# Patient Record
Sex: Female | Born: 1990 | Hispanic: No | Marital: Married | Smoking: Never smoker
Health system: Southern US, Community
[De-identification: ages and names within clinical notes are randomized; demographics above are authoritative.]

---

## 2015-11-20 ENCOUNTER — Emergency Department: Payer: BLUE CROSS/BLUE SHIELD

## 2015-11-20 ENCOUNTER — Observation Stay
Admission: EM | Admit: 2015-11-20 | Discharge: 2015-11-21 | Disposition: A | Discharge status: Home / Self Care | Payer: BLUE CROSS/BLUE SHIELD | Attending: Obstetrics and Gynecology | Admitting: Obstetrics and Gynecology

## 2015-11-20 DIAGNOSIS — R102 Pelvic and perineal pain: Secondary | ICD-10-CM

## 2015-11-20 DIAGNOSIS — K529 Noninfective gastroenteritis and colitis, unspecified: Secondary | ICD-10-CM | POA: Diagnosis not present

## 2015-11-20 DIAGNOSIS — O26891 Other specified pregnancy related conditions, first trimester: Secondary | ICD-10-CM | POA: Diagnosis not present

## 2015-11-20 DIAGNOSIS — R112 Nausea with vomiting, unspecified: Secondary | ICD-10-CM | POA: Insufficient documentation

## 2015-11-20 DIAGNOSIS — N83202 Unspecified ovarian cyst, left side: Secondary | ICD-10-CM | POA: Diagnosis not present

## 2015-11-20 DIAGNOSIS — R197 Diarrhea, unspecified: Secondary | ICD-10-CM | POA: Insufficient documentation

## 2015-11-20 DIAGNOSIS — O26899 Other specified pregnancy related conditions, unspecified trimester: Secondary | ICD-10-CM

## 2015-11-20 DIAGNOSIS — R109 Unspecified abdominal pain: Secondary | ICD-10-CM | POA: Diagnosis not present

## 2015-11-20 DIAGNOSIS — Z3A01 Less than 8 weeks gestation of pregnancy: Secondary | ICD-10-CM | POA: Diagnosis not present

## 2015-11-20 DIAGNOSIS — N83201 Unspecified ovarian cyst, right side: Secondary | ICD-10-CM | POA: Insufficient documentation

## 2015-11-20 DIAGNOSIS — R1084 Generalized abdominal pain: Secondary | ICD-10-CM | POA: Diagnosis present

## 2015-11-20 LAB — URINALYSIS COMPLETE WITH MICROSCOPIC (ARMC ONLY)
Bilirubin Urine: NEGATIVE
GLUCOSE, UA: NEGATIVE mg/dL
Hgb urine dipstick: NEGATIVE
Nitrite: NEGATIVE
PROTEIN: 30 mg/dL — AB
Specific Gravity, Urine: 1.026 (ref 1.005–1.030)
pH: 5 (ref 5.0–8.0)

## 2015-11-20 LAB — COMPREHENSIVE METABOLIC PANEL
ALK PHOS: 59 U/L (ref 38–126)
ALT: 11 U/L — AB (ref 14–54)
ANION GAP: 12 (ref 5–15)
AST: 17 U/L (ref 15–41)
Albumin: 4.2 g/dL (ref 3.5–5.0)
BILIRUBIN TOTAL: 0.6 mg/dL (ref 0.3–1.2)
BUN: 5 mg/dL — ABNORMAL LOW (ref 6–20)
CALCIUM: 9 mg/dL (ref 8.9–10.3)
CO2: 19 mmol/L — AB (ref 22–32)
CREATININE: 0.5 mg/dL (ref 0.44–1.00)
Chloride: 105 mmol/L (ref 101–111)
Glucose, Bld: 108 mg/dL — ABNORMAL HIGH (ref 65–99)
Potassium: 3.4 mmol/L — ABNORMAL LOW (ref 3.5–5.1)
SODIUM: 136 mmol/L (ref 135–145)
TOTAL PROTEIN: 7.1 g/dL (ref 6.5–8.1)

## 2015-11-20 LAB — HCG, QUANTITATIVE, PREGNANCY: hCG, Beta Chain, Quant, S: 64510 m[IU]/mL — ABNORMAL HIGH (ref <5)

## 2015-11-20 LAB — CBC
HEMATOCRIT: 46.2 % (ref 35.0–47.0)
HEMOGLOBIN: 16.2 g/dL — AB (ref 12.0–16.0)
MCH: 29.7 pg (ref 26.0–34.0)
MCHC: 35.1 g/dL (ref 32.0–36.0)
MCV: 84.5 fL (ref 80.0–100.0)
Platelets: 241 10*3/uL (ref 150–440)
RBC: 5.47 MIL/uL — AB (ref 3.80–5.20)
RDW: 12.7 % (ref 11.5–14.5)
WBC: 14.7 10*3/uL — ABNORMAL HIGH (ref 3.6–11.0)

## 2015-11-20 MED ORDER — MORPHINE SULFATE (PF) 2 MG/ML IV SOLN
2.0000 mg | Freq: Once | INTRAVENOUS | Status: AC
  Administered 2015-11-20: 2 mg via INTRAVENOUS

## 2015-11-20 MED ORDER — ONDANSETRON HCL 4 MG/2ML IJ SOLN
4.0000 mg | Freq: Once | INTRAMUSCULAR | Status: AC
  Administered 2015-11-20: 4 mg via INTRAVENOUS

## 2015-11-20 MED ORDER — SODIUM CHLORIDE 0.9 % IV BOLUS (SEPSIS)
1000.0000 mL | Freq: Once | INTRAVENOUS | Status: AC
  Administered 2015-11-20: 1000 mL via INTRAVENOUS

## 2015-11-20 MED ORDER — MORPHINE SULFATE (PF) 2 MG/ML IV SOLN
INTRAVENOUS | Status: AC
  Filled 2015-11-20: qty 1

## 2015-11-20 MED ORDER — ONDANSETRON HCL 4 MG/2ML IJ SOLN
INTRAMUSCULAR | Status: AC
  Filled 2015-11-20: qty 2

## 2015-11-20 NOTE — ED Notes (Signed)
Patient presents for abdominal cramping that started two days ago. Patient states N/V/D a couple of times over the past couple of days. Patient is pale, able to answer questions appropriately. Denies vaginal bleeding. States [redacted] weeks pregnant.

## 2015-11-20 NOTE — ED Notes (Signed)
Patient transported to Ultrasound 

## 2015-11-20 NOTE — ED Provider Notes (Signed)
[facility] Emergency Department Provider Note  ____________________________________________  Time seen: 11:30 PM  I have reviewed the triage vital signs and the nursing notes.   HISTORY  Chief Complaint Abdominal Cramping     HPI Wanda Wyatt is a 25 y.o. female gravida 2 para 1 proximally [redacted] weeks pregnant presents with generalized abdominal pain with onset 4 days ago accompanied by nausea patient denies any vomiting or diarrhea. Patient denies any vaginal bleeding. Patient has not seen obstetrics to date LMP 09/25/15. Patient states that her current pain score is 10 out of 10    Past medical history 1 previous successful pregnancy without any complications. There are no active problems to display for this patient.   Past surgical history None No current outpatient prescriptions on file.  Allergies No known drug allergies No family history on file.  Social History Social History  Substance Use Topics  . Smoking status: None  . Smokeless tobacco: None  . Alcohol Use: None    Review of Systems  Constitutional: Negative for fever. Eyes: Negative for visual changes. ENT: Negative for sore throat. Cardiovascular: Negative for chest pain. Respiratory: Negative for shortness of breath. Gastrointestinal: Positive for abdominal pain and nausea Genitourinary: Negative for dysuria. Musculoskeletal: Negative for back pain. Skin: Negative for rash. Neurological: Negative for headaches, focal weakness or numbness.   10-point ROS otherwise negative.  ____________________________________________   PHYSICAL EXAM:  VITAL SIGNS: ED Triage Vitals  Enc Vitals Group     BP 11/20/15 2224 94/76 mmHg     Pulse Rate 11/20/15 2224 92     Resp 11/20/15 2224 20     Temp 11/20/15 2224 98 F (36.7 C)     Temp Source 11/20/15 2224 Oral     SpO2 11/20/15 2224 97 %     Weight 11/20/15 2224 145 lb (65.772 kg)     Height 11/20/15 2224 5\' 4"  (1.626 m)      Head Cir --      Peak Flow --      Pain Score 11/20/15 2225 8     Pain Loc --      Pain Edu? --      Excl. in GC? --      Constitutional: Alert and oriented. Apparent distress, writhing in distress Eyes: Conjunctivae are normal. PERRL. Normal extraocular movements. ENT   Head: Normocephalic and atraumatic.   Nose: No congestion/rhinnorhea.   Mouth/Throat: Mucous membranes are moist.   Neck: No stridor. Hematological/Lymphatic/Immunilogical: No cervical lymphadenopathy. Cardiovascular: Normal rate, regular rhythm. Normal and symmetric distal pulses are present in all extremities. No murmurs, rubs, or gallops. Respiratory: Normal respiratory effort without tachypnea nor retractions. Breath sounds are clear and equal bilaterally. No wheezes/rales/rhonchi. Gastrointestinal: Generalized abdominal tenderness No distention. There is no CVA tenderness. Genitourinary: deferred Musculoskeletal: Nontender with normal range of motion in all extremities. No joint effusions.  No lower extremity tenderness nor edema. Neurologic:  Normal speech and language. No gross focal neurologic deficits are appreciated. Speech is normal.  Skin:  Skin is warm, dry and intact. No rash noted. Psychiatric: Mood and affect are normal. Speech and behavior are normal. Patient exhibits appropriate insight and judgment.  ____________________________________________    LABS (pertinent positives/negatives)  Labs Reviewed  CBC - Abnormal; Notable for the following:    WBC 14.7 (*)    RBC 5.47 (*)    Hemoglobin 16.2 (*)    All other components within normal limits  COMPREHENSIVE METABOLIC PANEL - Abnormal; Notable for the following:  Potassium 3.4 (*)    CO2 19 (*)    Glucose, Bld 108 (*)    BUN 5 (*)    ALT 11 (*)    All other components within normal limits  HCG, QUANTITATIVE, PREGNANCY - Abnormal; Notable for the following:    hCG, Beta Chain, Quant, S 25366 (*)    All other components within  normal limits  URINALYSIS COMPLETEWITH MICROSCOPIC ([facility] ONLY) - Abnormal; Notable for the following:    Color, Urine AMBER (*)    APPearance HAZY (*)    Ketones, ur 2+ (*)    Protein, ur 30 (*)    Leukocytes, UA 1+ (*)    Bacteria, UA MANY (*)    Squamous Epithelial / LPF 0-5 (*)    All other components within normal limits  CBC WITH DIFFERENTIAL/PLATELET      RADIOLOGY           Imaging Results       MR Abdomen Wo Contrast (Final result) Result time: 11/21/15 03:27:50   Procedure changed from MR Abdomen W Wo Contrast      Final result by Rad Results In Interface (11/21/15 03:27:50)   Narrative:   CLINICAL DATA: Patient is [redacted] weeks pregnant and presents with abdominal cramping starting 2 days ago. Nausea, vomiting, and diarrhea. No vaginal bleeding. No known injury.  EXAM: MRI ABDOMEN WITHOUT CONTRAST  TECHNIQUE: Multiplanar multisequence MR imaging was performed without the administration of intravenous contrast.  COMPARISON: None.  FINDINGS: Examination is performed without IV contrast material, limiting evaluation of solid organs. As visualized, the liver, spleen, gallbladder, bile ducts, pancreas, adrenal glands, kidneys, abdominal aorta, inferior vena cava, and retroperitoneal lymph nodes are unremarkable. There is moderate diffuse free fluid demonstrated throughout the abdomen and pelvis. Stomach, small bowel, and colon are not abnormally distended. There is diffuse wall thickening and edema with fold thickening involving much of the mid abdominal small bowel, likely representing the proximal to mid ileum. The terminal ileum is not involved. Appearances are consistent with infectious or inflammatory enteritis.  Pelvis: The uterus is identified with intra limited gestational sac visualized. No fetal pole is identified. Correlate with ultrasound pelvic report from today's date. Both ovaries are visualized. Small cysts in both ovaries. Normal  follicular changes. No abnormal adnexal masses. Bladder wall is not thickened. The appendix is identified and appears normal.  IMPRESSION: Diffuse wall thickening, fold thickening, and edema of the mid abdominal small bowel likely to represent enteritis. No obstruction. Diffuse free fluid in the abdomen and pelvis may be reactive. The appendix is identified and appears normal. No evidence to suggest appendicitis. No abnormal adnexal masses. Small cysts in both ovaries. Intrauterine gestational sac demonstrated but fetal pole is not visualized.   Electronically Signed By: Burman Nieves M.D. On: 11/21/2015 03:27          MR Pelvis Wo Contrast (Final result) Result time: 11/21/15 03:27:50   Final result by Rad Results In Interface (11/21/15 03:27:50)   Narrative:   CLINICAL DATA: Patient is [redacted] weeks pregnant and presents with abdominal cramping starting 2 days ago. Nausea, vomiting, and diarrhea. No vaginal bleeding. No known injury.  EXAM: MRI ABDOMEN WITHOUT CONTRAST  TECHNIQUE: Multiplanar multisequence MR imaging was performed without the administration of intravenous contrast.  COMPARISON: None.  FINDINGS: Examination is performed without IV contrast material, limiting evaluation of solid organs. As visualized, the liver, spleen, gallbladder, bile ducts, pancreas, adrenal glands, kidneys, abdominal aorta, inferior vena cava, and retroperitoneal lymph nodes are unremarkable.  There is moderate diffuse free fluid demonstrated throughout the abdomen and pelvis. Stomach, small bowel, and colon are not abnormally distended. There is diffuse wall thickening and edema with fold thickening involving much of the mid abdominal small bowel, likely representing the proximal to mid ileum. The terminal ileum is not involved. Appearances are consistent with infectious or inflammatory enteritis.  Pelvis: The uterus is identified with intra limited gestational  sac visualized. No fetal pole is identified. Correlate with ultrasound pelvic report from today's date. Both ovaries are visualized. Small cysts in both ovaries. Normal follicular changes. No abnormal adnexal masses. Bladder wall is not thickened. The appendix is identified and appears normal.  IMPRESSION: Diffuse wall thickening, fold thickening, and edema of the mid abdominal small bowel likely to represent enteritis. No obstruction. Diffuse free fluid in the abdomen and pelvis may be reactive. The appendix is identified and appears normal. No evidence to suggest appendicitis. No abnormal adnexal masses. Small cysts in both ovaries. Intrauterine gestational sac demonstrated but fetal pole is not visualized.   Electronically Signed By: Burman Nieves M.D. On: 11/21/2015 03:27          US Ob Transvaginal (Final result) Result time: 11/21/15 00:30:24   Final result by Rad Results In Interface (11/21/15 00:30:24)   Narrative:   CLINICAL DATA: 25 year old female mid abdominal pain and cramping.  EXAM: OBSTETRIC <14 WK [place] AND TRANSVAGINAL OB US  TECHNIQUE: Both transabdominal and transvaginal ultrasound examinations were performed for complete evaluation of the gestation as well as the maternal uterus, adnexal regions, and pelvic cul-de-sac. Transvaginal technique was performed to assess early pregnancy.  COMPARISON: None.  FINDINGS: There is an intrauterine cystic structure with apparent decidual reaction. No yolk sac or fetal pole identified within this structure. This cystic structure likely represents an early gestational sac given surrounding decidual reaction. A pseudogestation of an ectopic pregnancy is therefore less likely but not excluded. Correlation with clinical exam and follow-up with serial HCG levels and ultrasound recommended.  If this cystic structure is a true gestational sac the estimated gestational age based on mean sac diameter of 18  mm is 6 weeks, 5 days. However, the absence of fetal pole or a yolk sac in a gestational sac of this size, is concerning but not definitive indication of pregnancy failure. Close follow-up recommended.  Small subchorionic hemorrhage present (image 32/51) measuring 1.1 x 0.6 cm.  The right ovary measures 3.4 x 2.3 x 2.5 cm. There is a 2.0 x 1.7 x 1.8 cm hemorrhagic cyst with fluid/debris level in the right ovary. The left ovary is not visualized.  There is moderate amount of free fluid within the pelvis. This fluid appears simple without definite hemorrhagic component.  IMPRESSION: Intrauterine cystic structure as described. No fetal pole or yolk sac identified at this time. Correlation with clinical exam and follow-up with serial HCG levels and ultrasound recommended.  Right ovarian hemorrhagic cyst.  Moderate amount of simple appearing free fluid within the pelvis.  These results were called by telephone at the time of interpretation on 11/21/2015 at 12:20 am to Dr. Manson Passey who verbally acknowledged these results.   Electronically Signed By: Elgie Collard M.D. On: 11/21/2015 00:30          US OB Comp Less 14 Wks (Final result) Result time: 11/21/15 00:30:24   Final result by Rad Results In Interface (11/21/15 00:30:24)   Narrative:   CLINICAL DATA: 25 year old female mid abdominal pain and cramping.  EXAM: OBSTETRIC <14 WK [place] AND TRANSVAGINAL OB  US  TECHNIQUE: Both transabdominal and transvaginal ultrasound examinations were performed for complete evaluation of the gestation as well as the maternal uterus, adnexal regions, and pelvic cul-de-sac. Transvaginal technique was performed to assess early pregnancy.  COMPARISON: None.  FINDINGS: There is an intrauterine cystic structure with apparent decidual reaction. No yolk sac or fetal pole identified within this structure. This cystic structure likely represents an early gestational sac given  surrounding decidual reaction. A pseudogestation of an ectopic pregnancy is therefore less likely but not excluded. Correlation with clinical exam and follow-up with serial HCG levels and ultrasound recommended.  If this cystic structure is a true gestational sac the estimated gestational age based on mean sac diameter of 18 mm is 6 weeks, 5 days. However, the absence of fetal pole or a yolk sac in a gestational sac of this size, is concerning but not definitive indication of pregnancy failure. Close follow-up recommended.  Small subchorionic hemorrhage present (image 32/51) measuring 1.1 x 0.6 cm.  The right ovary measures 3.4 x 2.3 x 2.5 cm. There is a 2.0 x 1.7 x 1.8 cm hemorrhagic cyst with fluid/debris level in the right ovary. The left ovary is not visualized.  There is moderate amount of free fluid within the pelvis. This fluid appears simple without definite hemorrhagic component.  IMPRESSION: Intrauterine cystic structure as described. No fetal pole or yolk sac identified at this time. Correlation with clinical exam and follow-up with serial HCG levels and ultrasound recommended.  Right ovarian hemorrhagic cyst.  Moderate amount of simple appearing free fluid within the pelvis.  These results were called by telephone at the time of interpretation on 11/21/2015 at 12:20 am to Dr. Manson Passey who verbally acknowledged these results.   Electronically Signed By: Elgie Collard M.D. On: 11/21/2015 00:30       Critical Care performed: CRITICAL CARE Performed by: Darci Current   Total critical care time: 40 minutes  Critical care time was exclusive of separately billable procedures and treating other patients.  Critical care was necessary to treat or prevent imminent or life-threatening deterioration.  Critical care was time spent personally by me on the following activities: development of treatment plan with patient and/or surrogate as well as nursing,  discussions with consultants, evaluation of patient's response to treatment, examination of patient, obtaining history from patient or surrogate, ordering and performing treatments and interventions, ordering and review of laboratory studies, ordering and review of radiographic studies, pulse oximetry and re-evaluation of patient's condition.   ____________________________________________   INITIAL IMPRESSION / ASSESSMENT AND PLAN / ED COURSE  Pertinent labs & imaging results that were available during my care of the patient were reviewed by me and considered in my medical decision making (see chart for details).  Patient received IV morphine 2 mg and Zofran 4 mg on presentation to the emergency department. On reevaluation patient states that her pain is much improved. Patient discussed with Dr. Gwenyth Bender radiologist regarding ultrasound findings. Patient then discussed with Dr. Dalbert Garnet OB/GYN on call who evaluated the patient emergency department promptly. MRI of the abdomen revealed no evidence of appendicitis. Given hCG quantitative level beyond described discriminatory level, absence of fetal pole on ultrasound and intra-abdominal fluid patient admitted to Dr. Dalbert Garnet OB/GYN for further evaluation and management  ____________________________________________   FINAL CLINICAL IMPRESSION(S) / ED DIAGNOSES  Final diagnoses:  Pelvic pain in pregnancy      Darci Current, MD 11/21/15 [phone]

## 2015-11-21 ENCOUNTER — Encounter: Payer: Self-pay | Admitting: Obstetrics and Gynecology

## 2015-11-21 ENCOUNTER — Emergency Department: Payer: BLUE CROSS/BLUE SHIELD

## 2015-11-21 DIAGNOSIS — R109 Unspecified abdominal pain: Secondary | ICD-10-CM

## 2015-11-21 DIAGNOSIS — O26899 Other specified pregnancy related conditions, unspecified trimester: Secondary | ICD-10-CM

## 2015-11-21 LAB — CBC WITH DIFFERENTIAL/PLATELET
BASOS PCT: 0 %
Basophils Absolute: 0 10*3/uL (ref 0–0.1)
EOS ABS: 0.1 10*3/uL (ref 0–0.7)
EOS PCT: 1 %
HCT: 38.9 % (ref 35.0–47.0)
HEMOGLOBIN: 13.5 g/dL (ref 12.0–16.0)
Lymphocytes Relative: 25 %
Lymphs Abs: 1.7 10*3/uL (ref 1.0–3.6)
MCH: 30 pg (ref 26.0–34.0)
MCHC: 34.8 g/dL (ref 32.0–36.0)
MCV: 86.1 fL (ref 80.0–100.0)
MONOS PCT: 8 %
Monocytes Absolute: 0.5 10*3/uL (ref 0.2–0.9)
NEUTROS PCT: 66 %
Neutro Abs: 4.6 10*3/uL (ref 1.4–6.5)
PLATELETS: 205 10*3/uL (ref 150–440)
RBC: 4.51 MIL/uL (ref 3.80–5.20)
RDW: 12.6 % (ref 11.5–14.5)
WBC: 6.9 10*3/uL (ref 3.6–11.0)

## 2015-11-21 MED ORDER — MAGNESIUM CITRATE PO SOLN: 1.0000 | Freq: Once | ORAL | Status: DC | PRN

## 2015-11-21 MED ORDER — PRENATAL MULTIVITAMIN CH: 1.0000 | ORAL_TABLET | Freq: Every day | ORAL | Status: DC

## 2015-11-21 MED ORDER — ALUM & MAG HYDROXIDE-SIMETH 200-200-20 MG/5ML PO SUSP: 30.0000 mL | ORAL | Status: DC | PRN

## 2015-11-21 MED ORDER — LOPERAMIDE HCL 2 MG PO CAPS: 2.0000 mg | ORAL_CAPSULE | ORAL | Status: DC | PRN

## 2015-11-21 MED ORDER — PROCHLORPERAZINE EDISYLATE 5 MG/ML IJ SOLN
10.0000 mg | Freq: Four times a day (QID) | INTRAMUSCULAR | Status: DC | PRN
  Filled 2015-11-21: qty 2

## 2015-11-21 MED ORDER — BISACODYL 5 MG PO TBEC: 5.0000 mg | DELAYED_RELEASE_TABLET | Freq: Every day | ORAL | Status: DC | PRN

## 2015-11-21 MED ORDER — IBUPROFEN 600 MG PO TABS
600.0000 mg | ORAL_TABLET | Freq: Four times a day (QID) | ORAL | Status: DC | PRN
Start: 2015-11-21 — End: 2015-11-21

## 2015-11-21 MED ORDER — PIPERACILLIN-TAZOBACTAM 3.375 G IVPB: 3.3750 g | Freq: Once | INTRAVENOUS | Status: DC

## 2015-11-21 MED ORDER — SODIUM CHLORIDE 0.9 % IV SOLN
1.5000 g | Freq: Once | INTRAVENOUS | Status: AC
  Administered 2015-11-21: 1.5 g via INTRAVENOUS
  Filled 2015-11-21: qty 1.5

## 2015-11-21 MED ORDER — HYDROMORPHONE HCL 1 MG/ML IJ SOLN: 0.2000 mg | INTRAMUSCULAR | Status: DC | PRN

## 2015-11-21 MED ORDER — LACTATED RINGERS IV SOLN
INTRAVENOUS | Status: DC
  Administered 2015-11-21: 06:00:00 via INTRAVENOUS

## 2015-11-21 MED ORDER — TEMAZEPAM 15 MG PO CAPS: 15.0000 mg | ORAL_CAPSULE | Freq: Every evening | ORAL | Status: DC | PRN

## 2015-11-21 MED ORDER — DOCUSATE SODIUM 100 MG PO CAPS: 100.0000 mg | ORAL_CAPSULE | Freq: Two times a day (BID) | ORAL | Status: DC

## 2015-11-21 MED ORDER — MAGNESIUM HYDROXIDE 400 MG/5ML PO SUSP: 30.0000 mL | Freq: Every day | ORAL | Status: DC | PRN

## 2015-11-21 MED ORDER — OXYCODONE-ACETAMINOPHEN 5-325 MG PO TABS: 1.0000 | ORAL_TABLET | ORAL | Status: DC | PRN

## 2015-11-21 NOTE — ED Notes (Signed)
MD Manson PasseyBrown and OB at bedside.

## 2015-11-21 NOTE — Discharge Summary (Signed)
Physician Discharge Summary  Patient ID: Wanda Wyatt MRN: [id number] DOB/AGE: [birth year] 25 y.o.  Admit date: 11/20/2015 Discharge date: 11/21/2015  Admission Diagnoses:  Discharge Diagnoses:  Active Problems:   Abdominal pain in pregnancy, antepartum   Discharged Condition: good  Hospital Course: 25 y.o. G2P1001 at 6189w2d by LMP and 3011w5d by ultrasound today admitted for observation with acute onset abdominal pain 2 days ago. +nausea, no vomiting. No vaginal bleeding. bhcg 64,000 but no evidence of yolk sac or fetal pole. No evidence of ectopic pregnancy on MRI or TVUS, but with large amount of intra-abdominal and pelvic FF.Also w/ enteritis on MRI. Likely a failing IUP/anembryonic pregnancy. Hemodynamically stable. Admitted overnight for observation. Received no further pain control medications. Stable labs in the AM. Patient desired discharge home. Will f/u in 1 week for repeat ultrasound to confirm if pregnancy is failing. Precautions reviewed.   CBC Latest Ref Rng 11/21/2015 11/20/2015  WBC 3.6 - 11.0 K/uL 6.9 14.7(H)  Hemoglobin 12.0 - 16.0 g/dL 40.913.5 16.2(H)  Hematocrit 35.0 - 47.0 % 38.9 46.2  Platelets 150 - 440 K/uL 205 241   UA; +ketones, 1+ leuk, many bacteria  Discharge Exam: Blood pressure 98/50, pulse 67, temperature 98.3 F (36.8 C), temperature source Oral, resp. rate 10, height 5\' 4"  (1.626 m), weight 65.772 kg (145 lb), last menstrual period 10/01/2015, SpO2 100 %. General appearance: alert, cooperative and no distress GI: soft, mildly tender throughout without rebound or guarding Extremities: extremities normal, atraumatic, no cyanosis or edema  Disposition: 01-Home or Self Care     Medication List    Notice    You have not been prescribed any medications.         Follow-up Information    Follow up with Ala DachJohanna K Halfon, MD In 1 week.   Specialty:  Obstetrics and Gynecology   Contact information:   [address] [place] [id number] [phone]       Signed: Ala DachJohanna K Halfon 11/21/2015, 5:59 PM

## 2015-11-21 NOTE — ED Notes (Addendum)
Patient transported to MRI at this time via ED tech. Pt stable, NAD noted.

## 2015-11-21 NOTE — H&P (Signed)
FACULTY PRACTICE ANTEPARTUM ADMISSION HISTORY AND PHYSICAL NOTE   History of Present Illness: Wanda Wyatt is a 25 y.o. G2P1001 at [redacted]w[redacted]d by LMP and [redacted]w[redacted]d by ultrasound today admitted for observation with acute onset abdominal pain 2 days ago. +nausea, no vomiting. No vaginal bleeding. Pain intensifies with movement, improved with rest. No diarrhea or constipation. No fever, chills. Pain 9/10 at worst, improved with  of morphine in the ER.  Prior pregnancy with NSVD without complication. Denies history of STDs.  Ultrasound significant for moderate amount of simple fluid in abdominal cavity, but no adnexal pathology beyond simple hemorrhagic cyst in right ovary.  No complex ovarian or fallopian tube mass suggestive of ectopic pregnancy. Gestational sac with mean sac diameter of 18mm without yolk sac, suggestive of but not diagnostic of a failed IUP.  Beta quant 64,500 today.  She has received 1 dose of 1.5g Unasyn in the ER while awaiting MRI in case of ruptured appendicitis.  Patient Active Problem List   Diagnosis Date Noted  . Abdominal pain in pregnancy, antepartum 11/21/2015    History reviewed. No pertinent past medical history.  History reviewed. No pertinent past surgical history.  OB History  Gravida Para Term Preterm AB SAB TAB Ectopic Multiple Living  # Outcome Date GA Lbr Len/2nd Weight Sex Delivery Anes PTL Lv  2 Current           1 Term      Vag-Spont         Social History   Social History  . Marital Status: Married    Spouse Name: N/A  . Number of Children: N/A  . Years of Education: N/A   Social History Main Topics  . Smoking status: None  . Smokeless tobacco: None  . Alcohol Use: None  . Drug Use: None  . Sexual Activity: Not Asked   Other Topics Concern  . None   Social History Narrative    No family history on file.  No Known Allergies   (Not in a hospital admission)  Review of Systems - Negative except as noted in HPI    Vitals:  BP 93/60 mmHg  Pulse 73  Temp(Src) 98 F (36.7 C) (Oral)  Resp 14  Ht  (1.626 m)  Wt 145 lb (65.772 kg)  BMI 24.88 kg/m2  SpO2 98%  LMP 10/01/2015  Physical Examination: CONSTITUTIONAL: Well-developed, well-nourished female in no acute distress.  HENT:  Normocephalic, atraumatic, External right and left ear normal. Oropharynx is clear and moist EYES: Conjunctivae and EOM are normal. Pupils are equal, round, and reactive to light. No scleral icterus.  NECK: Normal range of motion, supple, no masses SKIN: Skin is warm and dry. No rash noted. Not diaphoretic. No erythema. No pallor. NEUROLGIC: Alert and oriented to person, place, and time. Normal reflexes, muscle tone coordination. No cranial nerve deficit noted. PSYCHIATRIC: Normal mood and affect. Normal behavior. Normal judgment and thought content. CARDIOVASCULAR: Normal heart rate noted, regular rhythm RESPIRATORY: Effort and breath sounds normal, no problems with respiration noted ABDOMEN: Soft, diffusely tender, nondistended. Tender to movement and palpation. +hypoactive bowel sounds, no tinkling.  MUSCULOSKELETAL: Normal range of motion. No edema and no tenderness. 2+ distal pulses.   Labs:  Results for orders placed or performed during the hospital encounter of 11/20/15 (from the past 24 hour(s))  CBC   Collection Time: 11/20/15 10:28 PM  Result Value Ref Range   WBC  14.7 (H) 3.6 - 11.0 K/uL   RBC 5.47 (H) 3.80 - 5.20 MIL/uL   Hemoglobin 16.2 (H) 12.0 - 16.0 g/dL   HCT 09.8 11.9 - 14.7 %   MCV 84.5 80.0 - 100.0 fL   MCH 29.7 26.0 - 34.0 pg   MCHC 35.1 32.0 - 36.0 g/dL   RDW 82.9 56.2 - 13.0 %   Platelets 241 150 - 440 K/uL  Comprehensive metabolic panel   Collection Time: 11/20/15 10:28 PM  Result Value Ref Range   Sodium 136 135 - 145 mmol/L   Potassium 3.4 (L) 3.5 - 5.1 mmol/L   Chloride 105 101 - 111 mmol/L   CO2 19 (L) 22 - 32 mmol/L   Glucose, Bld 108 (H) 65 - 99 mg/dL   BUN 5 (L) 6 - 20  mg/dL   Creatinine, Ser 8.65 0.44 - 1.00 mg/dL   Calcium 9.0 8.9 - 78.4 mg/dL   Total Protein 7.1 6.5 - 8.1 g/dL   Albumin 4.2 3.5 - 5.0 g/dL   AST 17 15 - 41 U/L   ALT 11 (L) 14 - 54 U/L   Alkaline Phosphatase 59 38 - 126 U/L   Total Bilirubin 0.6 0.3 - 1.2 mg/dL   GFR calc non Af Amer >60 >60 mL/min   GFR calc Af Amer >60 >60 mL/min   Anion gap 12 5 - 15  hCG, quantitative, pregnancy   Collection Time: 11/20/15 10:28 PM  Result Value Ref Range   hCG, Beta Chain, Quant, S 64510 (H) <5 mIU/mL  Urinalysis complete, with microscopic ([facility] only)   Collection Time: 11/20/15 10:31 PM  Result Value Ref Range   Color, Urine AMBER (A) YELLOW   APPearance HAZY (A) CLEAR   Glucose, UA NEGATIVE NEGATIVE mg/dL   Bilirubin Urine NEGATIVE NEGATIVE   Ketones, ur 2+ (A) NEGATIVE mg/dL   Specific Gravity, Urine 1.026 1.005 - 1.030   Hgb urine dipstick NEGATIVE NEGATIVE   pH 5.0 5.0 - 8.0   Protein, ur 30 (A) NEGATIVE mg/dL   Nitrite NEGATIVE NEGATIVE   Leukocytes, UA 1+ (A) NEGATIVE   RBC / HPF 0-5 0 - 5 RBC/hpf   WBC, UA 0-5 0 - 5 WBC/hpf   Bacteria, UA MANY (A) NONE SEEN   Squamous Epithelial / LPF 0-5 (A) NONE SEEN   Mucous PRESENT     Imaging Studies:  MRI Pelvis Wo Contrast  11/21/2015  CLINICAL DATA:  Patient is [redacted] weeks pregnant and presents with abdominal cramping starting 2 days ago. Nausea, vomiting, and diarrhea. No vaginal bleeding. No known injury. EXAM: MRI ABDOMEN WITHOUT CONTRAST TECHNIQUE: Multiplanar multisequence MR imaging was performed without the administration of intravenous contrast. COMPARISON:  None.   FINDINGS: Examination is performed without IV contrast material, limiting evaluation of solid organs. As visualized, the liver, spleen, gallbladder, bile ducts, pancreas, adrenal glands, kidneys, abdominal aorta, inferior vena cava, and retroperitoneal lymph nodes are unremarkable. There is moderate diffuse free fluid demonstrated throughout the abdomen and pelvis.  Stomach, small bowel, and colon are not abnormally distended. There is diffuse wall thickening and edema with fold thickening involving much of the mid abdominal small bowel, likely representing the proximal to mid ileum. The terminal ileum is not involved. Appearances are consistent with infectious or inflammatory enteritis. Pelvis: The uterus is identified with intra limited gestational sac visualized. No fetal pole is identified. Correlate with ultrasound pelvic report from today's date. Both ovaries are visualized. Small cysts in both ovaries. Normal follicular changes. No  abnormal adnexal masses. Bladder wall is not thickened. The appendix is identified and appears normal. IMPRESSION: Diffuse wall thickening, fold thickening, and edema of the mid abdominal small bowel likely to represent enteritis. No obstruction. Diffuse free fluid in the abdomen and pelvis may be reactive. The appendix is identified and appears normal. No evidence to suggest appendicitis. No abnormal adnexal masses. Small cysts in both ovaries. Intrauterine gestational sac demonstrated but fetal pole is not visualized. Electronically Signed   By: Burman NievesWilliam  Stevens M.D.   On: 11/21/2015 03:27   [place] Ob Comp Less 14 Wks  11/21/2015  CLINICAL DATA:  25 year old female mid abdominal pain and cramping. EXAM: OBSTETRIC <14 WK US AND TRANSVAGINAL OB US TECHNIQUE: Both transabdominal and transvaginal ultrasound examinations were performed for complete evaluation of the gestation as well as the maternal uterus, adnexal regions, and pelvic cul-de-sac. Transvaginal technique was performed to assess early pregnancy. COMPARISON:  None.   FINDINGS: There is an intrauterine cystic structure with apparent decidual reaction. No yolk sac or fetal pole identified within this structure. This cystic structure likely represents an early gestational sac given surrounding decidual reaction. A pseudogestation of an ectopic pregnancy is therefore less likely but not  excluded. Correlation with clinical exam and follow-up with serial HCG levels and ultrasound recommended. If this cystic structure is a true gestational sac the estimated gestational age based on mean sac diameter of 18 mm is 6 weeks, 5 days. However, the absence of fetal pole or a yolk sac in a gestational sac of this size, is concerning but not definitive indication of pregnancy failure. Close follow-up recommended. Small subchorionic hemorrhage present (image 32/51) measuring 1.1 x 0.6 cm. The right ovary measures 3.4 x 2.3 x 2.5 cm. There is a 2.0 x 1.7 x 1.8 cm hemorrhagic cyst with fluid/debris level in the right ovary. The left ovary is not visualized. There is moderate amount of free fluid within the pelvis. This fluid appears simple without definite hemorrhagic component. IMPRESSION: Intrauterine cystic structure as described. No fetal pole or yolk sac identified at this time. Correlation with clinical exam and follow-up with serial HCG levels and ultrasound recommended. Right ovarian hemorrhagic cyst. Moderate amount of simple appearing free fluid within the pelvis. These results were called by telephone at the time of interpretation on 11/21/2015 at 12:20 am to Dr. Manson PasseyBrown who verbally acknowledged these results. Electronically Signed   By: Elgie CollardArash  Radparvar M.D.   On: 11/21/2015 00:30    Assessment and Plan: Patient Active Problem List   Diagnosis Date Noted  . Abdominal pain in pregnancy, antepartum 11/21/2015   The clinical picture is unclear. DDx includes simple enteritis with reactive fluid, ruptured hemorrhagic cyst or ectopic pregnancy. Whether or not she has a failed IUP is also uncertain. She does have notable fluid in her peritoneal cavity but the fluid appears simple and not like blood, but no clear source of bleeding. Her H/H is also not suggestive of significant intraabdominal bleeding.   Attention must be given to possibility of ectopic pregnancy; however, MRI was reassuring that  adenxa are normal. Her normal hgb/hct, temp and heartrate are reassuring. Therefore, need for immediate dx laparoscopy not warranted.   She is admitted for observation and pain control. She will be NPO in case of acute worsening. IVF, pain control, antiemetics and anti-diarrheals ordered. Currently serial ultrasounds as an outpatient will help decide whether truly failed pregnancy and she has two issues coinciding.

## 2015-11-21 NOTE — Progress Notes (Signed)
Patient understands all discharge instructions and the need to make follow up appointments. Patient discharge via wheelchair with auxillary. 

## 2015-11-28 ENCOUNTER — Ambulatory Visit
Admission: RE | Admit: 2015-11-28 | Discharge: 2015-11-28 | Disposition: A | Discharge status: Home / Self Care | Payer: BLUE CROSS/BLUE SHIELD | Source: Ambulatory Visit | Attending: Obstetrics and Gynecology | Admitting: Obstetrics and Gynecology

## 2015-11-28 ENCOUNTER — Other Ambulatory Visit: Payer: Self-pay | Admitting: Obstetrics and Gynecology

## 2015-11-28 DIAGNOSIS — O200 Threatened abortion: Secondary | ICD-10-CM | POA: Diagnosis not present

## 2015-11-28 DIAGNOSIS — Z3A01 Less than 8 weeks gestation of pregnancy: Secondary | ICD-10-CM | POA: Insufficient documentation

## 2015-11-28 DIAGNOSIS — O208 Other hemorrhage in early pregnancy: Secondary | ICD-10-CM | POA: Diagnosis not present

## 2015-12-13 ENCOUNTER — Encounter: Payer: Self-pay | Admitting: Emergency Medicine

## 2015-12-13 ENCOUNTER — Emergency Department
Admission: EM | Admit: 2015-12-13 | Discharge: 2015-12-13 | Disposition: A | Discharge status: Home / Self Care | Payer: BLUE CROSS/BLUE SHIELD | Attending: Emergency Medicine | Admitting: Emergency Medicine

## 2015-12-13 DIAGNOSIS — O039 Complete or unspecified spontaneous abortion without complication: Secondary | ICD-10-CM

## 2015-12-13 DIAGNOSIS — O021 Missed abortion: Secondary | ICD-10-CM | POA: Diagnosis not present

## 2015-12-13 DIAGNOSIS — O209 Hemorrhage in early pregnancy, unspecified: Secondary | ICD-10-CM | POA: Diagnosis present

## 2015-12-13 LAB — ABO/RH: ABO/RH(D): B POS

## 2015-12-13 LAB — BASIC METABOLIC PANEL
ANION GAP: 9 (ref 5–15)
BUN: 5 mg/dL — ABNORMAL LOW (ref 6–20)
CALCIUM: 9.1 mg/dL (ref 8.9–10.3)
CO2: 21 mmol/L — AB (ref 22–32)
Chloride: 107 mmol/L (ref 101–111)
Creatinine, Ser: 0.36 mg/dL — ABNORMAL LOW (ref 0.44–1.00)
GFR calc Af Amer: >60 mL/min (ref >60)
GFR calc non Af Amer: >60 mL/min (ref >60)
GLUCOSE: 96 mg/dL (ref 65–99)
POTASSIUM: 3.4 mmol/L — AB (ref 3.5–5.1)
Sodium: 137 mmol/L (ref 135–145)

## 2015-12-13 LAB — CBC WITH DIFFERENTIAL/PLATELET
Basophils Absolute: 0 10*3/uL (ref 0–0.1)
Basophils Relative: 0 %
Eosinophils Absolute: 0.2 10*3/uL (ref 0–0.7)
Eosinophils Relative: 2 %
HEMATOCRIT: 41 % (ref 35.0–47.0)
Hemoglobin: 14.2 g/dL (ref 12.0–16.0)
LYMPHS PCT: 17 %
Lymphs Abs: 1.6 10*3/uL (ref 1.0–3.6)
MCH: 29.5 pg (ref 26.0–34.0)
MCHC: 34.6 g/dL (ref 32.0–36.0)
MCV: 85.4 fL (ref 80.0–100.0)
MONO ABS: 0.5 10*3/uL (ref 0.2–0.9)
MONOS PCT: 5 %
NEUTROS ABS: 7.2 10*3/uL — AB (ref 1.4–6.5)
Neutrophils Relative %: 76 %
Platelets: 210 10*3/uL (ref 150–440)
RBC: 4.8 MIL/uL (ref 3.80–5.20)
RDW: 12.7 % (ref 11.5–14.5)
WBC: 9.5 10*3/uL (ref 3.6–11.0)

## 2015-12-13 LAB — HCG, QUANTITATIVE, PREGNANCY: hCG, Beta Chain, Quant, S: 36840 m[IU]/mL — ABNORMAL HIGH (ref <5)

## 2015-12-13 MED ORDER — SODIUM CHLORIDE 0.9 % IV BOLUS (SEPSIS)
1000.0000 mL | Freq: Once | INTRAVENOUS | Status: AC
  Administered 2015-12-13: 1000 mL via INTRAVENOUS

## 2015-12-13 MED ORDER — MORPHINE SULFATE (PF) 4 MG/ML IV SOLN
4.0000 mg | Freq: Once | INTRAVENOUS | Status: AC
Start: 2015-12-13 — End: 2015-12-13
  Administered 2015-12-13: 4 mg via INTRAVENOUS
  Filled 2015-12-13: qty 1

## 2015-12-13 MED ORDER — HYDROCODONE-ACETAMINOPHEN 5-325 MG PO TABS: 1.0000 | ORAL_TABLET | Freq: Four times a day (QID) | ORAL | Status: DC | PRN

## 2015-12-13 MED ORDER — ONDANSETRON HCL 4 MG/2ML IJ SOLN
4.0000 mg | Freq: Once | INTRAMUSCULAR | Status: AC
  Administered 2015-12-13: 4 mg via INTRAVENOUS
  Filled 2015-12-13: qty 2

## 2015-12-13 NOTE — Discharge Instructions (Signed)
Return to the emergency department for any worsening pain, heavy bleeding such as saturating a pad every hour for few hours, or certainly any dizziness or passing out.  Follow-up with your OB/GYN next week call Monday for appointment this week.   Miscarriage A miscarriage is the sudden loss of an unborn baby (fetus) before the 20th week of pregnancy. Most miscarriages happen in the first 3 months of pregnancy. Sometimes, it happens before a woman even knows she is pregnant. A miscarriage is also called a "spontaneous miscarriage" or "early pregnancy loss." Having a miscarriage can be an emotional experience. Talk with your caregiver about any questions you may have about miscarrying, the grieving process, and your future pregnancy plans. CAUSES   Problems with the fetal chromosomes that make it impossible for the baby to develop normally. Problems with the baby's genes or chromosomes are most often the result of errors that occur, by chance, as the embryo divides and grows. The problems are not inherited from the parents.  Infection of the cervix or uterus.   Hormone problems.   Problems with the cervix, such as having an incompetent cervix. This is when the tissue in the cervix is not strong enough to hold the pregnancy.   Problems with the uterus, such as an abnormally shaped uterus, uterine fibroids, or congenital abnormalities.   Certain medical conditions.   Smoking, drinking alcohol, or taking illegal drugs.   Trauma.  Often, the cause of a miscarriage is unknown.  SYMPTOMS   Vaginal bleeding or spotting, with or without cramps or pain.  Pain or cramping in the abdomen or lower back.  Passing fluid, tissue, or blood clots from the vagina. DIAGNOSIS  Your caregiver will perform a physical exam. You may also have an ultrasound to confirm the miscarriage. Blood or urine tests may also be ordered. TREATMENT   Sometimes, treatment is not necessary if you naturally pass all  the fetal tissue that was in the uterus. If some of the fetus or placenta remains in the body (incomplete miscarriage), tissue left behind may become infected and must be removed. Usually, a dilation and curettage (D and C) procedure is performed. During a D and C procedure, the cervix is widened (dilated) and any remaining fetal or placental tissue is gently removed from the uterus.  Antibiotic medicines are prescribed if there is an infection. Other medicines may be given to reduce the size of the uterus (contract) if there is a lot of bleeding.  If you have Rh negative blood and your baby was Rh positive, you will need a Rh immunoglobulin shot. This shot will protect any future baby from having Rh blood problems in future pregnancies. HOME CARE INSTRUCTIONS   Your caregiver may order bed rest or may allow you to continue light activity. Resume activity as directed by your caregiver.  Have someone help with home and family responsibilities during this time.   Keep track of the number of sanitary pads you use each day and how soaked (saturated) they are. Write down this information.   Do not use tampons. Do not douche or have sexual intercourse until approved by your caregiver.   Only take over-the-counter or prescription medicines for pain or discomfort as directed by your caregiver.   Do not take aspirin. Aspirin can cause bleeding.   Keep all follow-up appointments with your caregiver.   If you or your partner have problems with grieving, talk to your caregiver or seek counseling to help cope with the pregnancy  loss. Allow enough time to grieve before trying to get pregnant again.  SEEK IMMEDIATE MEDICAL CARE IF:   You have severe cramps or pain in your back or abdomen.  You have a fever.  You pass large blood clots (walnut-sized or larger) ortissue from your vagina. Save any tissue for your caregiver to inspect.   Your bleeding increases.   You have a thick,  bad-smelling vaginal discharge.  You become lightheaded, weak, or you faint.   You have chills.  MAKE SURE YOU:  Understand these instructions.  Will watch your condition.  Will get help right away if you are not doing well or get worse.   This information is not intended to replace advice given to you by your health care provider. Make sure you discuss any questions you have with your health care provider.   Document Released: 12/15/2000 Document Revised: 10/16/2012 Document Reviewed: 08/10/2011 Elsevier Interactive Patient Education [employer]2016 [employer] Inc.

## 2015-12-13 NOTE — ED Notes (Signed)
NAD noted at time of D/C. Pt states understanding of D/C instructions. Pt taken to the lobby via wheelchair by her husband.

## 2015-12-13 NOTE — ED Notes (Signed)
States began vaginal bleeding yesterday. States approx [redacted] weeks pregnant. [facility] dx [facility]ighted ov[facility].

## 2015-12-13 NOTE — ED Provider Notes (Signed)
[facility] Emergency Department Provider Note   ____________________________________________  Time seen: Approximately 7:50am I have reviewed the triage vital signs and the triage nursing note.  HISTORY  Chief Complaint Vaginal Bleeding   Historian Patient  HPI Wanda Wyatt is a 25 y.o. female G1P0 here for vaginal bleeding and cramping which started by spotting in the morning yesterday and became strong cramping and heavy bleeding overnight and this morning. She's been diagnosed with blighted ovum, failed IUP, after no progression of first trimester pregnancy [facility]. [facility], and then she started spotting and bleeding yesterday and this morning.  Pain of lower abdominal cramping is 9 out of 10 at present. She has passed heavy clots.    History reviewed. No pertinent past medical history.  Patient Active Problem List   Diagnosis Date Noted  . Abdominal pain in pregnancy, antepartum 11/21/2015    History reviewed. No pertinent past surgical history.  Current Outpatient Rx  Name  Route  Sig  Dispense  Refill  . Prenatal Vit-Fe Fumarate-FA (PRENATAL MULTIVITAMIN/IRON PO)   Oral   Take 3 tablets by mouth 2 (two) times daily.         Marland Kitchen. HYDROcodone-acetaminophen (NORCO/VICODIN) 5-325 MG tablet   Oral   Take 1 tablet by mouth every 6 (six) hours as needed for moderate pain.   6 tablet   0     Allergies Review of patient's allergies indicates no known allergies.  No family history on file.  Social History Social History  Substance Use Topics  . Smoking status: Never Smoker   . Smokeless tobacco: Never Used  . Alcohol Use: No    Review of Systems  Constitutional: Negative for fever. Eyes: Negative for visual changes. ENT: Negative for sore throat. Cardiovascular: Negative for chest pain. Respiratory: Negative for shortness of  breath. Gastrointestinal: Negative for vomiting. Genitourinary: Negative for dysuria. Musculoskeletal: Negative for back pain. Skin: Negative for rash. Neurological: Negative for headache. 10 point Review of Systems otherwise negative ____________________________________________   PHYSICAL EXAM:  VITAL SIGNS: ED Triage Vitals  Enc Vitals Group     BP 12/13/15 0724 108/73 mmHg     Pulse Rate 12/13/15 0724 75     Resp 12/13/15 0724 18     Temp 12/13/15 0724 97.9 F (36.6 C)     Temp Source 12/13/15 0724 Oral     SpO2 12/13/15 0724 99 %     Weight 12/13/15 0724 145 lb (65.772 kg)     Height 12/13/15 0724 5\' 4"  (1.626 m)     Head Cir --      Peak Flow --      Pain Score 12/13/15 0724 10     Pain Loc --      Pain Edu? --      Excl. in GC? --      Constitutional: Alert and oriented. Well appearing and in no distress, but does appear uncomfortable  HEENT   Head: Normocephalic and atraumatic.      Eyes: Conjunctivae are normal. PERRL. Normal extraocular movements.      Ears:         Nose: No congestion/rhinnorhea.   Mouth/Throat: Mucous membranes are moist.   Neck: No stridor. Cardiovascular/Chest: Normal rate, regular rhythm.  No murmurs, rubs, or gallops. Respiratory: Normal respiratory effort without tachypnea nor retractions. Breath sounds are clear and equal bilaterally. No wheezes/rales/rhonchi. Gastrointestinal: Soft.  Moderate tenderness suprapubic and lower abdomen without guarding or rebound. Genitourinary/rectal:Deferred Musculoskeletal: Nontender with normal range of motion in all extremities. No joint effusions.  No lower extremity tenderness.  No edema. Neurologic:  Normal speech and language. No gross or focal neurologic deficits are appreciated. Skin:  Skin is warm, dry and intact. No rash noted. Psychiatric: Mood and affect are normal. Speech and behavior are normal. Patient exhibits appropriate insight and  judgment.  ____________________________________________   EKG I, Governor Rooks, MD, the attending physician have personally viewed and interpreted all ECGs.  None ____________________________________________  LABS (pertinent positives/negatives)  Labs Reviewed  BASIC METABOLIC PANEL - Abnormal; Notable for the following:    Potassium 3.4 (*)    CO2 21 (*)    BUN 5 (*)    Creatinine, Ser 0.36 (*)    All other components within normal limits  CBC WITH DIFFERENTIAL/PLATELET - Abnormal; Notable for the following:    Neutro Abs 7.2 (*)    All other components within normal limits  HCG, QUANTITATIVE, PREGNANCY - Abnormal; Notable for the following:    hCG, Beta Chain, Quant, [place] [address] (*)    All other components within normal limits  ABO/RH    ____________________________________________  RADIOLOGY All Xrays were viewed by me. Imaging interpreted by Radiologist.  None __________________________________________  PROCEDURES  Procedure(s) performed: None  Critical Care performed: None  ____________________________________________   ED COURSE / ASSESSMENT AND PLAN  Pertinent labs & imaging results that were available during my care of the patient were reviewed by me and considered in my medical decision making (see chart for details).   Clinically patient is passing a miscarriage so was diagnosed as a failed first trimester pregnancy on ultrasound a few days ago.  She looks clinically stable with no hypotension, tachycardia, fever, guarding or rebound, passing out, and her hemoglobin is 14.  I will treat her symptomatic with morphine, Zofran, and IV fluids and discussed with OB/GYN.  Patient feels symptomatically much better. Discussed with Dr. Elesa Massed, OB/GYN, who recommends loading the patient miscarry naturally, rather than D&C at this point time. She is okay for outpatient follow-up with her OB/GYN.  Blood type B positive, from this year, documented through care  everywhere.   CONSULTATIONS:   OB/GYN, Dr. Elesa Massed.   Patient / Family / Caregiver informed of clinical course, medical decision-making process, and agree with plan.   I discussed return precautions, follow-up instructions, and discharged instructions with patient and/or family.   ___________________________________________   FINAL CLINICAL IMPRESSION(S) / ED DIAGNOSES   Final diagnoses:  Miscarriage              Note: This dictation was prepared with Dragon dictation. Any transcriptional errors that result from this process are unintentional   Governor Rooks, MD 12/13/15 1102

## 2016-10-15 IMAGING — MR MR ABDOMEN W/O CM
6 of 12 series · 23 of 48 positions shown · non-contrast
Comparison: None.

CLINICAL DATA: Patient is 7 weeks pregnant and presents with
abdominal cramping starting 2 days ago. Nausea, vomiting, and
diarrhea. No vaginal bleeding. No known injury.

EXAM:
MRI ABDOMEN WITHOUT CONTRAST
TECHNIQUE: Multiplanar multisequence MR imaging was performed without the
administration of intravenous contrast.

[Series 3: T2 · axial · 5.0mm · 0.74mm/px · z∈[-132,+252]mm · 3 of 65 slices shown (1 of 2)]
[im 1/65]
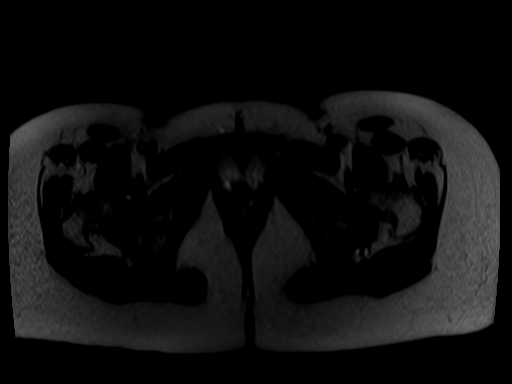
[im 33/65]
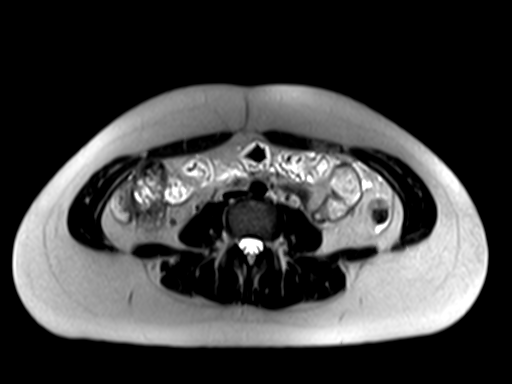
[im 65/65]
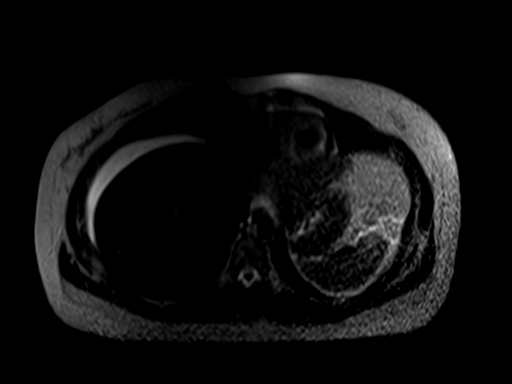

[Series 5: T2 fat-sat · axial · 5.0mm · 0.74mm/px · z∈[-132,+252]mm · 3 of 65 slices shown]
[im 1/65]
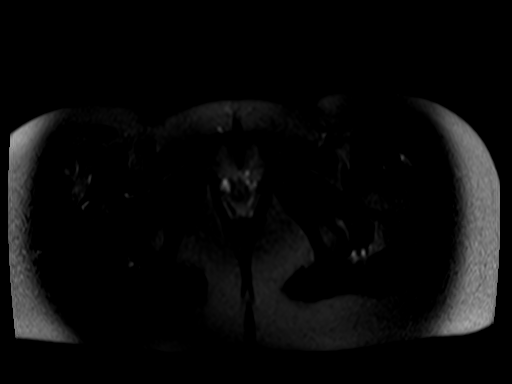
[im 33/65]
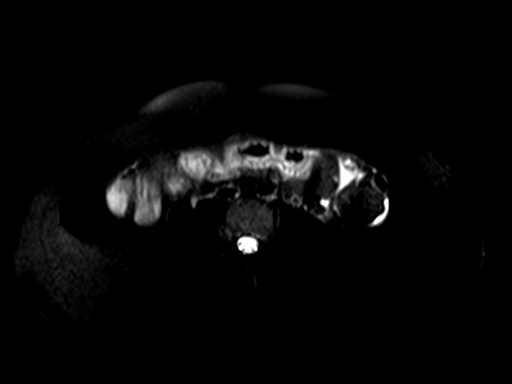
[im 65/65]
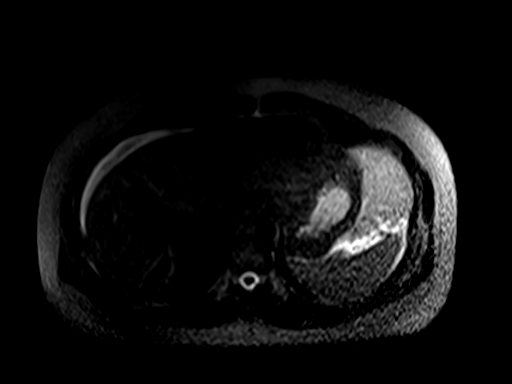

[Series 7: T2 · coronal · 6.0mm · 0.82mm/px · 1 of 30 slices shown (2 of 2)]
[im 1/30]
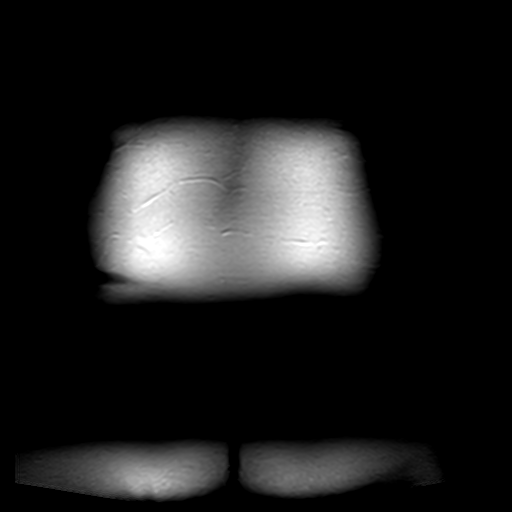

[Series 10: bSSFP · axial · 5.0mm · 0.70mm/px · z∈[-172,+278]mm · 4 of 76 slices shown]
[im 1/76]
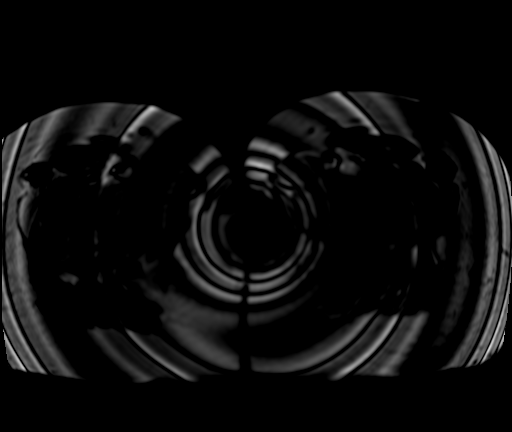
[im 26/76]
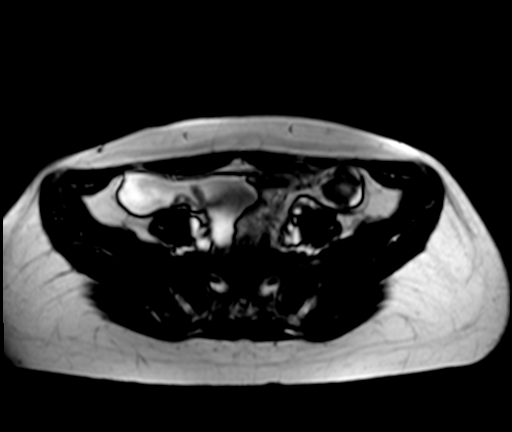
[im 51/76]
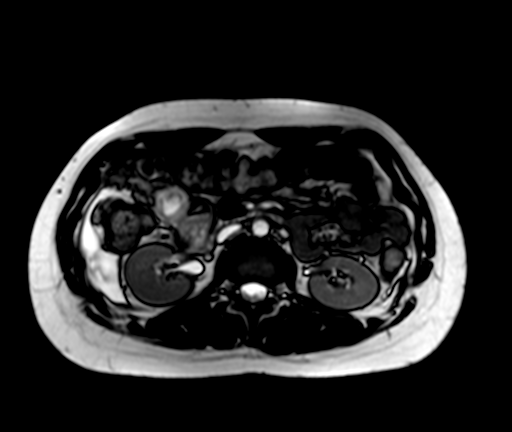
[im 76/76]
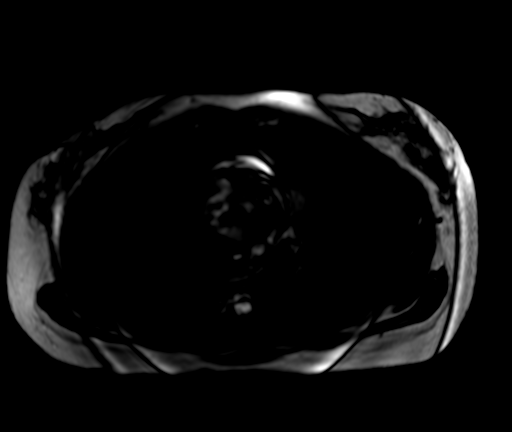

[Series 11: axial spgr upper · axial · 3.0mm · 0.74mm/px · z∈[-100,+281]mm · 7 of 128 slices shown]
[im 1/128]
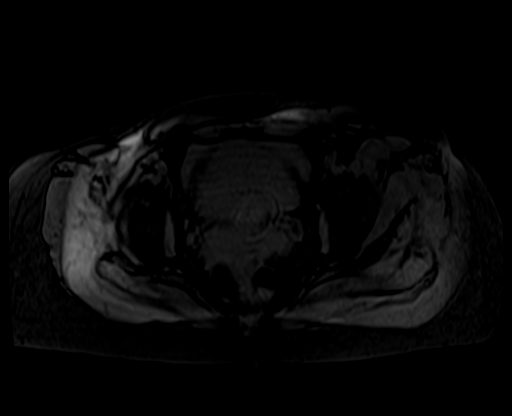
[im 22/128]
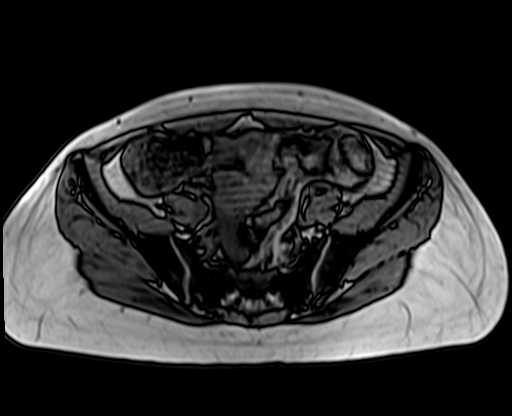
[im 43/128]
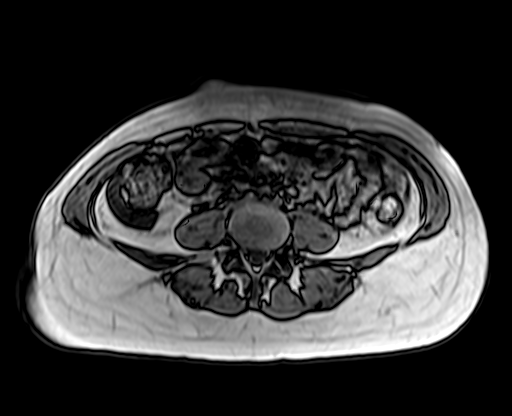
[im 64/128]
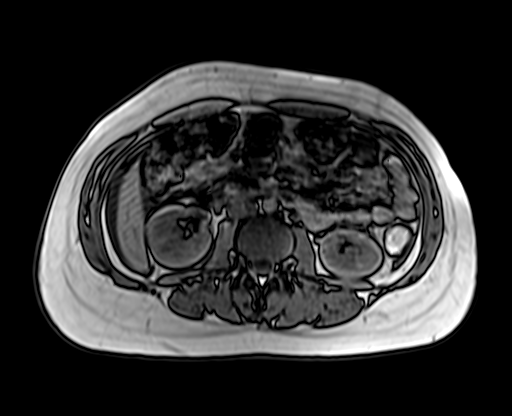
[im 85/128]
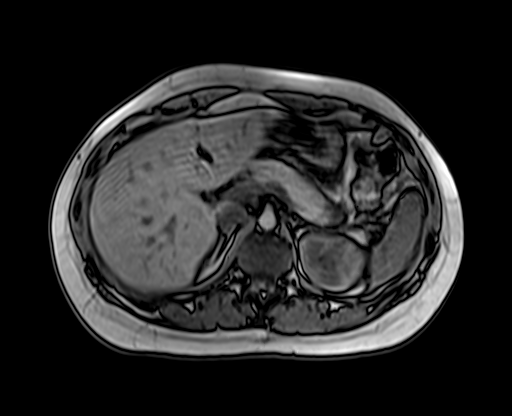
[im 106/128]
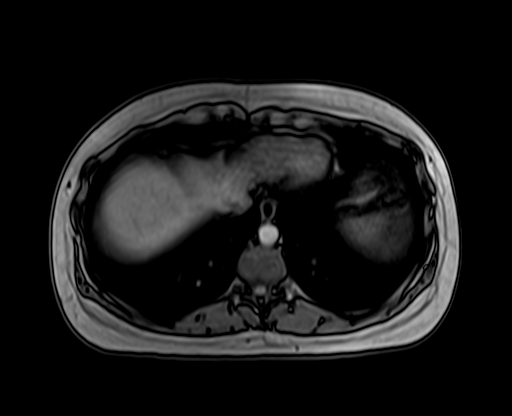
[im 128/128]
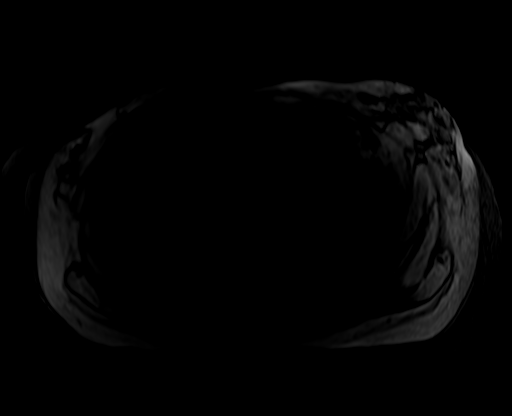

[Series 12: axial spgr lower · axial · 3.0mm · 0.74mm/px · z∈[-198,+54]mm · 5 of 128 slices shown]
[im 1/128]
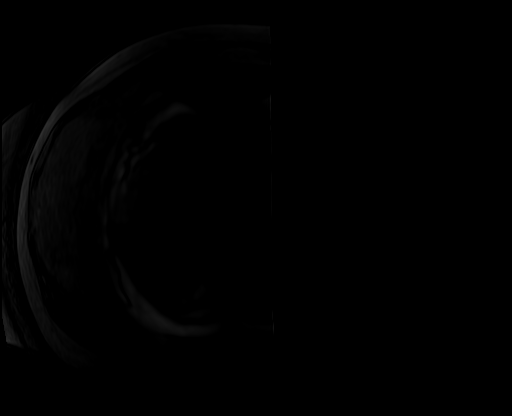
[im 22/128]
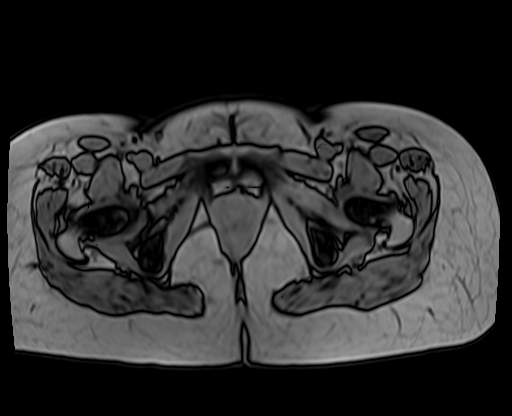
[im 43/128]
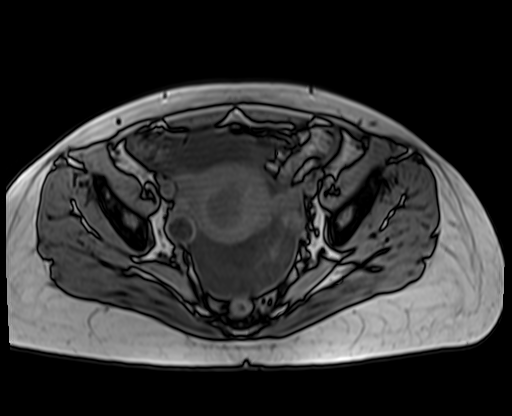
[im 64/128]
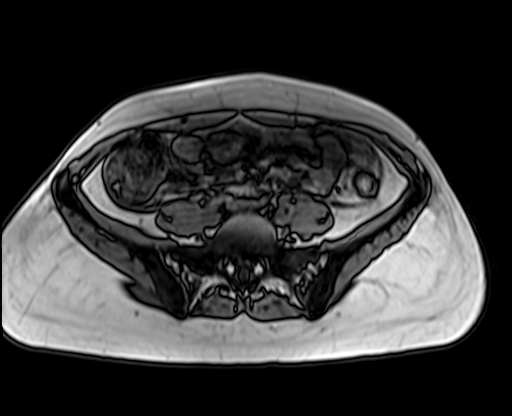
[im 85/128]
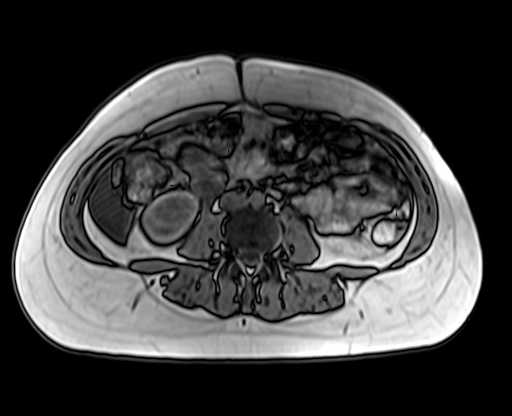

[23 of 48 positions shown; findings below may reference images not displayed]

FINDINGS: Examination is performed without IV contrast material, limiting
evaluation of solid organs. As visualized, the liver, spleen,
gallbladder, bile ducts, pancreas, adrenal glands, kidneys,
abdominal aorta, inferior vena cava, and retroperitoneal lymph nodes
are unremarkable. There is moderate diffuse free fluid demonstrated
throughout the abdomen and pelvis. Stomach, small bowel, and colon
are not abnormally distended. There is diffuse wall thickening and
edema with fold thickening involving much of the mid abdominal small
bowel, likely representing the proximal to mid ileum. The terminal
ileum is not involved. Appearances are consistent with infectious or
inflammatory enteritis.

Pelvis: The uterus is identified with intra limited gestational sac
visualized. No fetal pole is identified. Correlate with ultrasound
pelvic report from today's date. Both ovaries are visualized. Small
cysts in both ovaries. Normal follicular changes. No abnormal
adnexal masses. Bladder wall is not thickened. The appendix is
identified and appears normal.
IMPRESSION: Diffuse wall thickening, fold thickening, and edema of the mid
abdominal small bowel likely to represent enteritis. No obstruction.
Diffuse free fluid in the abdomen and pelvis may be reactive. The
appendix is identified and appears normal. No evidence to suggest
appendicitis. No abnormal adnexal masses. Small cysts in both
ovaries. Intrauterine gestational sac demonstrated but fetal pole is
not visualized.

## 2017-08-17 IMAGING — US US OB TRANSVAGINAL
1 series · 13 of 28 positions shown · non-contrast
Comparison: MRI 11/21/2015.  Ultrasound 11/20/2015.

CLINICAL DATA: Threatened abortion.

EXAM:
OBSTETRIC <14 WK US AND TRANSVAGINAL OB US
TECHNIQUE: Both transabdominal and transvaginal ultrasound examinations were
performed for complete evaluation of the gestation as well as the
maternal uterus, adnexal regions, and pelvic cul-de-sac.
Transvaginal technique was performed to assess early pregnancy.

[Series 1: us ob transvaginal · 0.26mm/px · 13 of 121 slices shown]
[im 5/121]
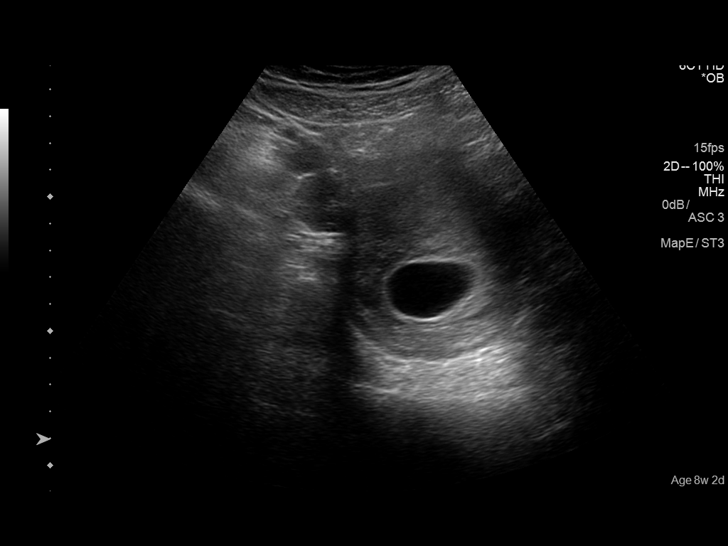
[im 14/121]
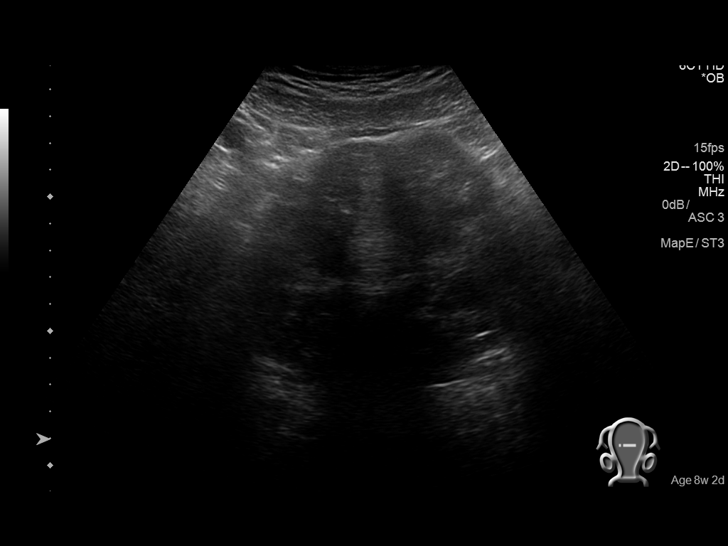
[im 23/121]
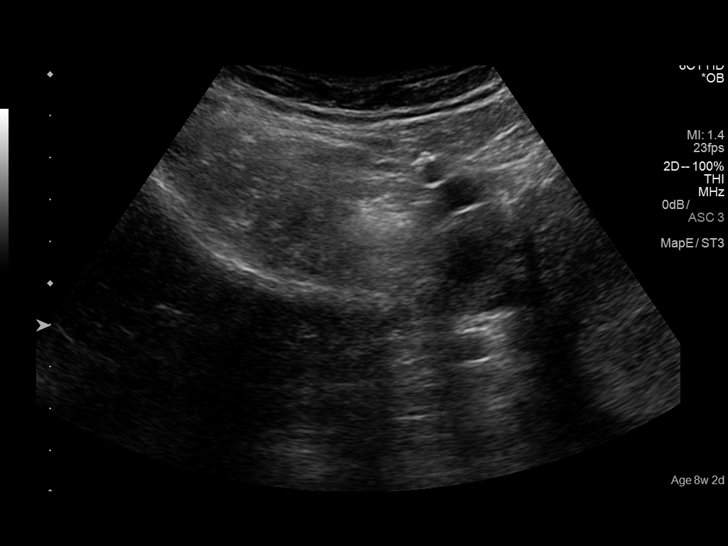
[im 32/121]
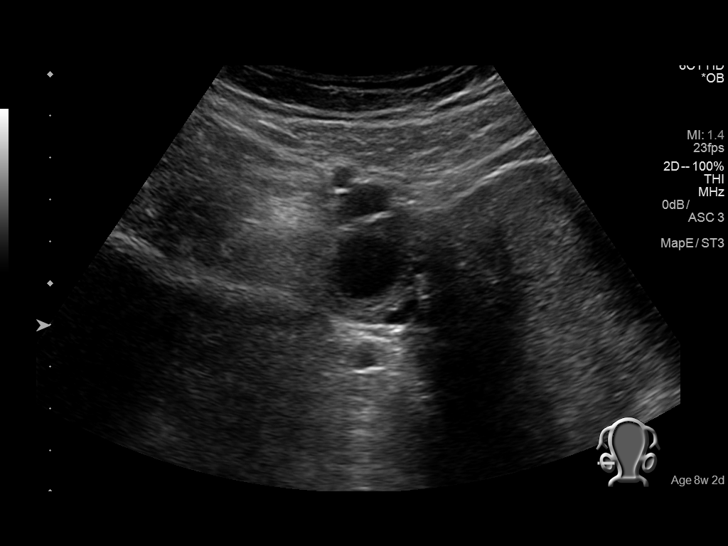
[im 41/121]
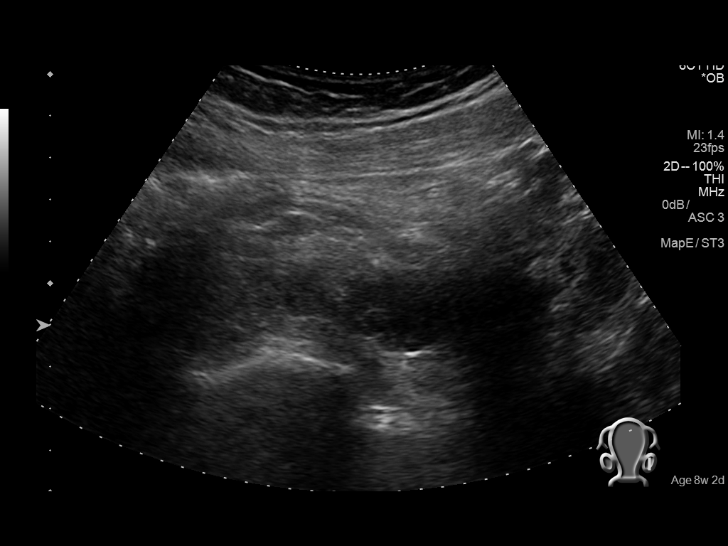
[im 49/121]
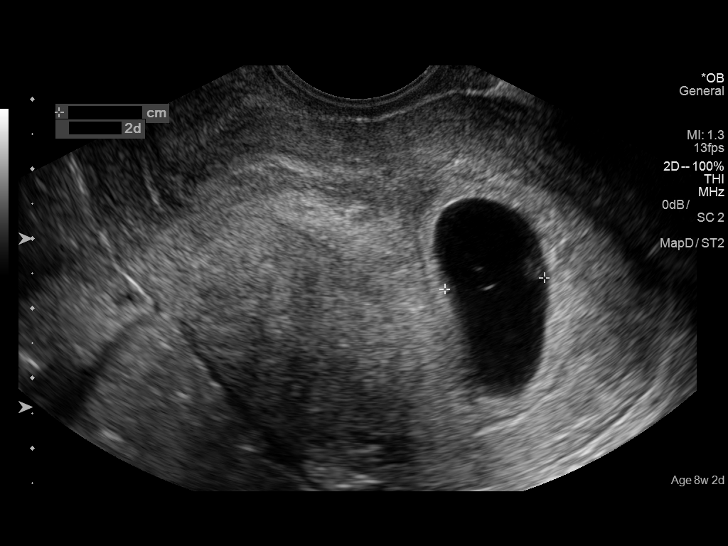
[im 63/121]
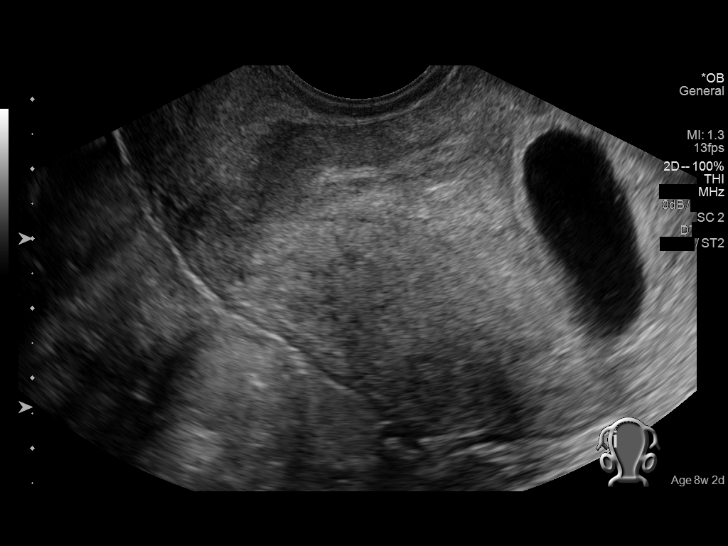
[im 72/121]
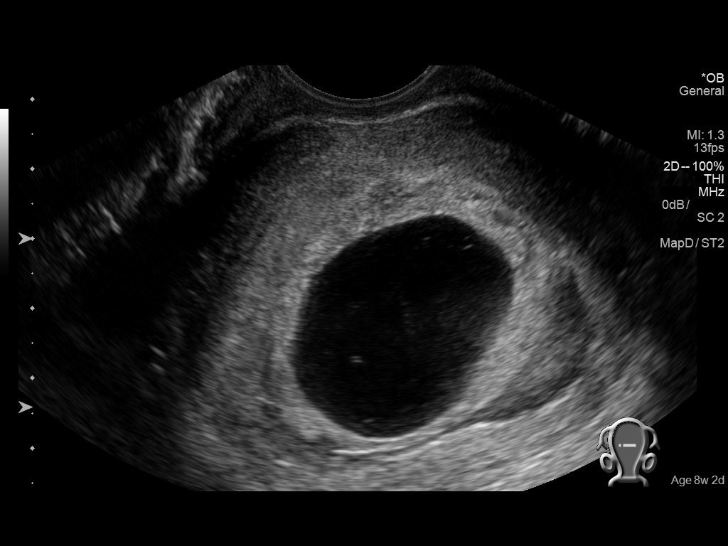
[im 81/121]
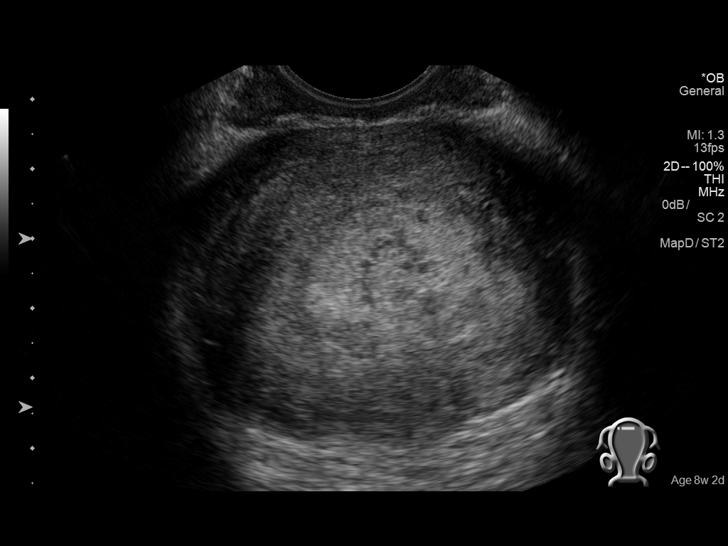
[im 89/121]
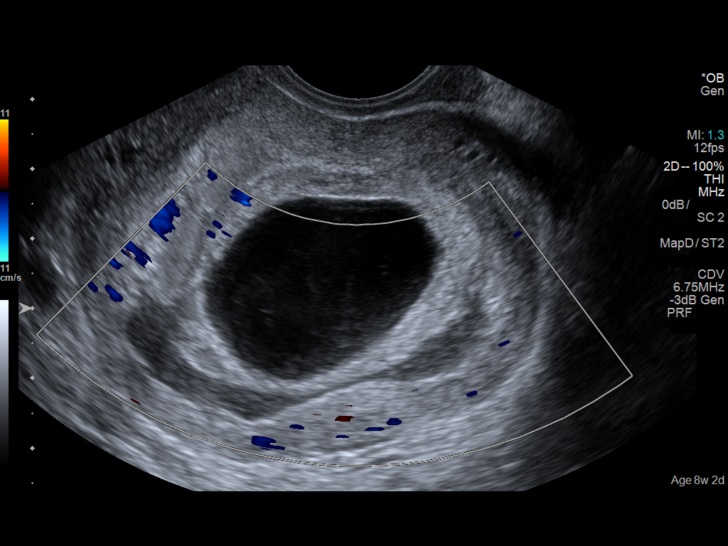
[im 98/121]
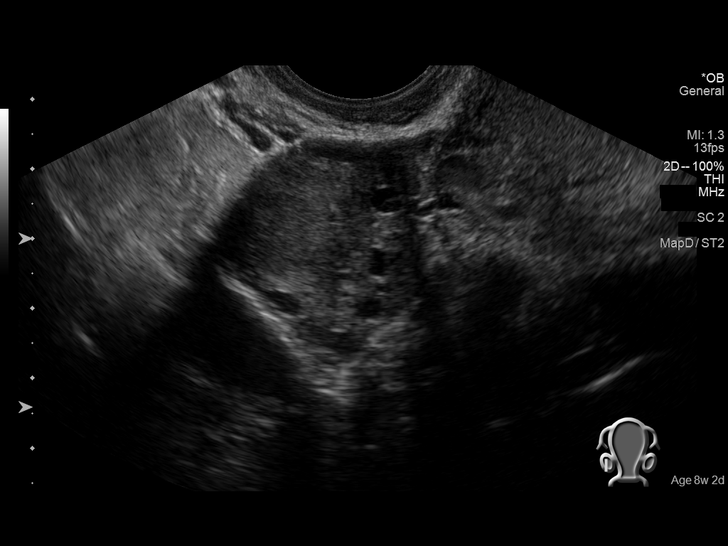
[im 107/121]
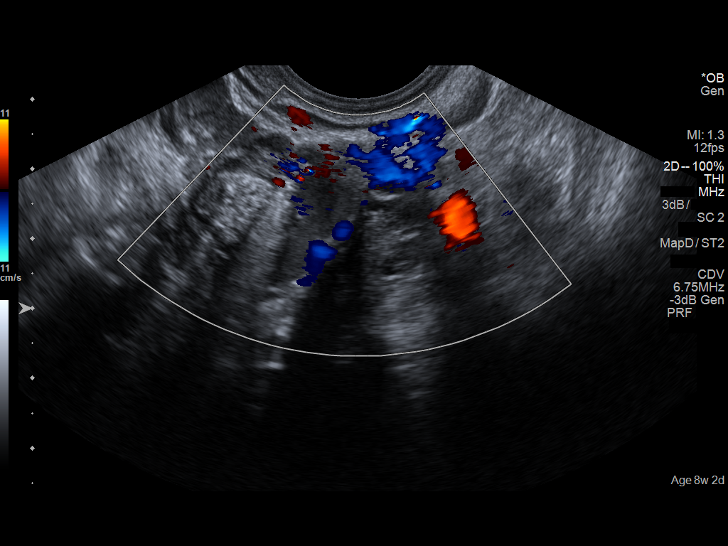
[im 116/121]
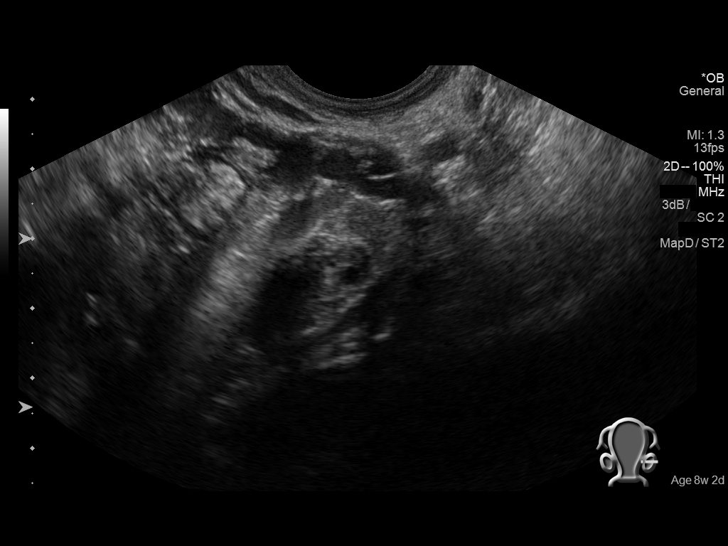

[13 of 28 positions shown; findings below may reference images not displayed]

FINDINGS: Intrauterine gestational sac: Single

Yolk sac:  2 yolk sacs noted.

Embryo:  No fetal pole noted.

Cardiac Activity: None

MSD: 2.5 cm 7 w   4  d

Subchorionic hemorrhage: Subchorionic hemorrhage is again noted and
has enlarged. The hemorrhage measures 5.0 x 1.1 cm

Maternal uterus/adnexae: 2.2 x 2.3 x 1.8 cm simple cyst left ovary.
No significant free pelvic fluid noted on today's exam.
IMPRESSION: 1. Single intrauterine gestational sac with 2 yolk sacs noted. No
fetal pole noted this time. Mean sac diameter corresponds is 7 weeks
4 days. Probable early intrauterine gestational sac, but no fetal
pole, or cardiac activity yet visualized. Recommend follow-up
quantitative B-HCG levels and follow-up US in 14 days to confirm and
assess viability. This recommendation follows SRU consensus
guidelines: Diagnostic Criteria for Nonviable Pregnancy Early in the
First Trimester. N Engl J Med 7983; [DATE].
2. Subchorionic hemorrhage is again noted and has enlarged. Image
measures 50. x 1.1 cm.
3. Free pelvic fluid previously identified is no longer identified.

## 2017-09-07 ENCOUNTER — Encounter: Payer: Self-pay | Admitting: Emergency Medicine

## 2017-09-07 ENCOUNTER — Other Ambulatory Visit: Payer: Self-pay

## 2017-09-07 ENCOUNTER — Ambulatory Visit
Admission: EM | Admit: 2017-09-07 | Discharge: 2017-09-07 | Disposition: A | Discharge status: Home / Self Care | Payer: BLUE CROSS/BLUE SHIELD | Attending: Family Medicine | Admitting: Family Medicine

## 2017-09-07 DIAGNOSIS — R21 Rash and other nonspecific skin eruption: Secondary | ICD-10-CM | POA: Diagnosis not present

## 2017-09-07 DIAGNOSIS — M25561 Pain in right knee: Secondary | ICD-10-CM | POA: Diagnosis not present

## 2017-09-07 DIAGNOSIS — M25562 Pain in left knee: Secondary | ICD-10-CM

## 2017-09-07 MED ORDER — SULFAMETHOXAZOLE-TRIMETHOPRIM 800-160 MG PO TABS: 1.0000 | ORAL_TABLET | Freq: Two times a day (BID) | ORAL | 0 refills | Status: AC

## 2017-09-07 MED ORDER — PREDNISONE 10 MG PO TABS: ORAL_TABLET | ORAL | 0 refills | Status: DC

## 2017-09-07 NOTE — ED Triage Notes (Addendum)
Patient in today c/o bilateral knee pain x 2 days. Patient states they feel swollen. No injury noted. Patient has not tried anything OTC.

## 2017-09-07 NOTE — Discharge Instructions (Signed)
Take medication as prescribed. Rest. Drink plenty of fluids. Take over the counter claritin. Avoid scratching. Monitor closely.   Follow up with your primary care physician this week as needed. Return to Urgent care for new or worsening concerns.

## 2017-09-07 NOTE — ED Provider Notes (Signed)
[facility] ____________________________________________  Time seen: Approximately 5:04 PM  I have reviewed the triage vital signs and the nursing notes.   HISTORY  Chief Complaint Knee Pain (bilateral)  HPI Wanda Wyatt is a 27 y.o. female presenting for evaluation of bilateral knee discomfort since yesterday.  Patient reports knee discomfort was not present first thing in the morning, but reports noticed sometime in the afternoon and was gradual in onset.  Patient states that knee discomfort is a tightness and slight discomfort.  Denies any decreased range of motion, fall, direct trauma.  Denies known trigger.  States this afternoon her knees bilaterally feel like they have mild tightness to it as well as looked at her right knee and noticed a red rash. Denies any break in skin.  Reports some itching to her knees.  Denies any other skin changes.  Denies known insect bite.  States no fevers.  Feels well otherwise. No over-the-counter medications taken today for the same complaints. Denies any changes in foods, medicines, lotions, detergents or other contacts.  In further discussing with patient, patient states that yesterday morning while at work she was wearing then leggings and then had to organize some things on a lower cabinet, in which she was putting pressure on bilateral knees on the floor, but denies any other change.  Denies pain radiation, paresthesias, leg swelling or history of similar. No pain with range of motion.  Denies chest pain, shortness of breath, abdominal pain, dysuria. Denies recent sickness. Denies recent antibiotic use.   Patient's last menstrual period was 08/12/2017.Denies pregnancy.  Reports 4 months postpartum.  No longer breast-feeding.  History reviewed. No pertinent past medical history.  Patient Active Problem List   Diagnosis Date Noted  . Abdominal pain in pregnancy, antepartum 11/21/2015    History reviewed. No pertinent surgical  history.   No current facility-administered medications for this encounter.   Current Outpatient Medications:  .  predniSONE (DELTASONE) 10 MG tablet, Start 60 mg po day one, then 50 mg po day two, taper by 10 mg daily until complete., Disp: 21 tablet, Rfl: 0 .  sulfamethoxazole-trimethoprim (BACTRIM DS,SEPTRA DS) 800-160 MG tablet, Take 1 tablet by mouth 2 (two) times daily for 7 days., Disp: 14 tablet, Rfl: 0  Allergies Patient has no known allergies.  Family History  Problem Relation Age of Onset  . Healthy Mother   . Healthy Father     Social History Social History   Tobacco Use  . Smoking status: Never Smoker  . Smokeless tobacco: Never Used  Substance Use Topics  . Alcohol use: No    Alcohol/week: 0.0 oz  . Drug use: No    Review of Systems Constitutional: No fever/chills Cardiovascular: Denies chest pain. Respiratory: Denies shortness of breath. Gastrointestinal: No abdominal pain.  No nausea, no vomiting.  No diarrhea.   Musculoskeletal: Negative for back pain. Skin:As above.    ____________________________________________   PHYSICAL EXAM:  VITAL SIGNS: ED Triage Vitals [09/07/17 1624]  Enc Vitals Group     BP 130/72     Pulse Rate 75     Resp 16     Temp 98.1 F (36.7 C)     Temp Source Oral     SpO2 100 %     Weight 175 lb (79.4 kg)     Height 5\' 5"  (1.651 m)     Head Circumference      Peak Flow      Pain Score 4     Pain  Loc      Pain Edu?      Excl. in GC?     Constitutional: Alert and oriented. Well appearing and in no acute distress. Cardiovascular: Normal rate, regular rhythm. Grossly normal heart sounds.  Good peripheral circulation. Respiratory: Normal respiratory effort without tachypnea nor retractions. Breath sounds are clear and equal bilaterally. No wheezes, rales, rhonchi. Gastrointestinal: Soft and nontender.  Musculoskeletal:  Nontender with normal range of motion in all extremities. No midline cervical, thoracic or lumbar  tenderness to palpation. Bilateral pedal pulses equal and easily palpated.      Right lower leg:  No tenderness or edema.      Left lower leg:  No tenderness or edema.  Except:        Images of bilateral knees as above with erythema present to anterior knee more diffusely with with small area to superior left knee similar, and with minimal tenderness to direct percussion to right erythematous area.  Right erythema present with  urticarial appearance with some pruritus, no obvious edema bilaterally, no palpable effusion, no pain with anterior posterior drawer, no pain with varus or valgus stress, full range of motion present, no pain with resisted flexion or extension, and able to flex past 90 degrees no induration, no fluctuance, no drainage, skin intact.,   Bilateral lower extremity is otherwise nontender. Neurologic:  Normal speech and language. No gross focal neurologic deficits are appreciated. Speech is normal. No gait instability.  Skin:  Skin is warm, dry and intact. No rash noted. Psychiatric: Mood and affect are normal. Speech and behavior are normal. Patient exhibits appropriate insight and judgment   ___________________________________________   LABS (all labs ordered are listed, but only abnormal results are displayed)  Labs Reviewed - No data to display  PROCEDURES Procedures     INITIAL IMPRESSION / ASSESSMENT AND PLAN / ED COURSE  Pertinent labs & imaging results that were available during my care of the patient were reviewed by me and considered in my medical decision making (see chart for details).  Well-appearing patient.  No acute distress.  Bilateral knee tightness.  Upon initial exam with patient only right anterior knee redness was present, patient then felt an itch to left knee and the above erythematous area..  No known changes in contacts.  No mechanical injury.  Suspect patient with contact allergy, likely from contact while at work yesterday.  Suspect  inflammatory reaction rather than infectious, but discussed in detail with patient some concern for cellulitis.  Discussed will empirically treat patient with oral prednisone, over-the-counter antihistamine orally, supportive care but will also empirically start patient on oral Bactrim.  Discussed strict monitoring and return parameters.Discussed indication, risks and benefits of medications with patient.  Discussed follow up with Primary care physician this week. Discussed follow up and return parameters including no resolution or any worsening concerns. Patient verbalized understanding and agreed to plan.   ____________________________________________   FINAL CLINICAL IMPRESSION(S) / ED DIAGNOSES  Final diagnoses:  Acute pain of both knees  Rash     ED Discharge Orders        Ordered    predniSONE (DELTASONE) 10 MG tablet     09/07/17 1707    sulfamethoxazole-trimethoprim (BACTRIM DS,SEPTRA DS) 800-160 MG tablet  2 times daily     09/07/17 1707       Note: This dictation was prepared with Dragon dictation along with smaller phrase technology. Any transcriptional errors that result from this process are unintentional.  Renford DillsMiller, Wynne Rozak, NP 09/07/17 1929

## 2018-09-14 ENCOUNTER — Encounter: Payer: Self-pay | Admitting: Emergency Medicine

## 2018-09-14 ENCOUNTER — Other Ambulatory Visit: Payer: Self-pay

## 2018-09-14 ENCOUNTER — Emergency Department
Admission: EM | Admit: 2018-09-14 | Discharge: 2018-09-14 | Disposition: A | Discharge status: Home / Self Care | Payer: BLUE CROSS/BLUE SHIELD | Attending: Emergency Medicine | Admitting: Emergency Medicine

## 2018-09-14 DIAGNOSIS — R0602 Shortness of breath: Secondary | ICD-10-CM | POA: Insufficient documentation

## 2018-09-14 DIAGNOSIS — R42 Dizziness and giddiness: Secondary | ICD-10-CM | POA: Insufficient documentation

## 2018-09-14 DIAGNOSIS — L299 Pruritus, unspecified: Secondary | ICD-10-CM | POA: Diagnosis present

## 2018-09-14 DIAGNOSIS — F410 Panic disorder [episodic paroxysmal anxiety] without agoraphobia: Secondary | ICD-10-CM | POA: Insufficient documentation

## 2018-09-14 DIAGNOSIS — L509 Urticaria, unspecified: Secondary | ICD-10-CM | POA: Diagnosis not present

## 2018-09-14 DIAGNOSIS — R110 Nausea: Secondary | ICD-10-CM | POA: Insufficient documentation

## 2018-09-14 DIAGNOSIS — T7840XA Allergy, unspecified, initial encounter: Secondary | ICD-10-CM | POA: Diagnosis not present

## 2018-09-14 LAB — HEPATIC FUNCTION PANEL
ALBUMIN: 4 g/dL (ref 3.5–5.0)
ALT: 28 U/L (ref 0–44)
AST: 26 U/L (ref 15–41)
Alkaline Phosphatase: 77 U/L (ref 38–126)
Bilirubin, Direct: <0.1 mg/dL (ref 0.0–0.2)
Total Bilirubin: 0.7 mg/dL (ref 0.3–1.2)
Total Protein: 7 g/dL (ref 6.5–8.1)

## 2018-09-14 LAB — CBC WITH DIFFERENTIAL/PLATELET
ABS IMMATURE GRANULOCYTES: 0.06 10*3/uL (ref 0.00–0.07)
BASOS ABS: 0 10*3/uL (ref 0.0–0.1)
Basophils Relative: 0 %
EOS ABS: 0 10*3/uL (ref 0.0–0.5)
Eosinophils Relative: 0 %
HCT: 43.1 % (ref 36.0–46.0)
Hemoglobin: 14.6 g/dL (ref 12.0–15.0)
IMMATURE GRANULOCYTES: 1 %
LYMPHS ABS: 1.6 10*3/uL (ref 0.7–4.0)
Lymphocytes Relative: 13 %
MCH: 28.9 pg (ref 26.0–34.0)
MCHC: 33.9 g/dL (ref 30.0–36.0)
MCV: 85.2 fL (ref 80.0–100.0)
MONOS PCT: 4 %
Monocytes Absolute: 0.4 10*3/uL (ref 0.1–1.0)
NEUTROS ABS: 10.5 10*3/uL — AB (ref 1.7–7.7)
NEUTROS PCT: 82 %
NRBC: 0 % (ref 0.0–0.2)
PLATELETS: 216 10*3/uL (ref 150–400)
RBC: 5.06 MIL/uL (ref 3.87–5.11)
RDW: 12.2 % (ref 11.5–15.5)
WBC: 12.6 10*3/uL — ABNORMAL HIGH (ref 4.0–10.5)

## 2018-09-14 LAB — BASIC METABOLIC PANEL
ANION GAP: 9 (ref 5–15)
BUN: 11 mg/dL (ref 6–20)
CALCIUM: 8.6 mg/dL — AB (ref 8.9–10.3)
CHLORIDE: 110 mmol/L (ref 98–111)
CO2: 21 mmol/L — AB (ref 22–32)
Creatinine, Ser: 0.61 mg/dL (ref 0.44–1.00)
GFR calc non Af Amer: >60 mL/min (ref >60)
GLUCOSE: 128 mg/dL — AB (ref 70–99)
POTASSIUM: 3.5 mmol/L (ref 3.5–5.1)
Sodium: 140 mmol/L (ref 135–145)

## 2018-09-14 LAB — LIPASE, BLOOD: Lipase: 29 U/L (ref 11–51)

## 2018-09-14 MED ORDER — METOCLOPRAMIDE HCL 5 MG/ML IJ SOLN
10.0000 mg | Freq: Once | INTRAMUSCULAR | Status: AC
  Administered 2018-09-14: 10 mg via INTRAVENOUS
  Filled 2018-09-14: qty 2

## 2018-09-14 MED ORDER — EPINEPHRINE 0.3 MG/0.3ML IJ SOAJ: 0.3000 mg | INTRAMUSCULAR | 0 refills | Status: DC | PRN

## 2018-09-14 MED ORDER — ONDANSETRON HCL 4 MG/2ML IJ SOLN
4.0000 mg | Freq: Once | INTRAMUSCULAR | Status: AC
  Administered 2018-09-14: 4 mg via INTRAVENOUS
  Filled 2018-09-14: qty 2

## 2018-09-14 MED ORDER — METHYLPREDNISOLONE SODIUM SUCC 125 MG IJ SOLR
125.0000 mg | Freq: Once | INTRAMUSCULAR | Status: AC
  Administered 2018-09-14: 125 mg via INTRAVENOUS
  Filled 2018-09-14: qty 2

## 2018-09-14 MED ORDER — PREDNISONE 20 MG PO TABS: 40.0000 mg | ORAL_TABLET | Freq: Every day | ORAL | 0 refills | Status: AC

## 2018-09-14 MED ORDER — DIPHENHYDRAMINE HCL 50 MG/ML IJ SOLN
25.0000 mg | Freq: Once | INTRAMUSCULAR | Status: AC
  Administered 2018-09-14: 25 mg via INTRAVENOUS
  Filled 2018-09-14: qty 1

## 2018-09-14 MED ORDER — FAMOTIDINE IN NACL 20-0.9 MG/50ML-% IV SOLN
20.0000 mg | Freq: Once | INTRAVENOUS | Status: AC
  Administered 2018-09-14: 20 mg via INTRAVENOUS
  Filled 2018-09-14: qty 50

## 2018-09-14 MED ORDER — SODIUM CHLORIDE 0.9 % IV BOLUS
1000.0000 mL | Freq: Once | INTRAVENOUS | Status: AC
  Administered 2018-09-14: 1000 mL via INTRAVENOUS

## 2018-09-14 NOTE — ED Notes (Signed)
Pt actively vomiting during assessment.  Zofran administered as ordered.

## 2018-09-14 NOTE — ED Notes (Signed)
Medications administered as ordered.  Pt states she is "not feeling well".  States nausea remains, states she feels cold.  Pt placed on cardiac monitor for more close monitoring.  PT states she feels as if she may pass out after IV benadryl, will monitor pt closely.

## 2018-09-14 NOTE — ED Provider Notes (Signed)
[facility] Emergency Department Provider Note ____________________________________________   First MD Initiated Contact with Patient 09/14/18 1110     (approximate)  I have reviewed the triage vital signs and the nursing notes.   HISTORY  Chief Complaint Allergic Reaction    HPI Wanda Wyatt is a 28 y.o. female with no significant PMH who presents with concern for allergic reaction, acute onset yesterday after she ate, and initially characterized by itching and hives.  The patient states she then became panicked and felt lightheaded and somewhat short of breath.  She called EMS and they evaluated her but she states they told her she was okay and gave her the option of whether to come to the hospital.  She took Benadryl yesterday afternoon and evening and was feeling better.  Then this morning, the itching came back and she had nausea and discomfort in her throat.  She denies shortness of breath.  No prior history of allergic reactions.  History reviewed. No pertinent past medical history.  Patient Active Problem List   Diagnosis Date Noted  . Abdominal pain in pregnancy, antepartum 11/21/2015    History reviewed. No pertinent surgical history.  Prior to Admission medications   Medication Sig Start Date End Date Taking? Authorizing Provider  EPINEPHrine (EPIPEN 2-PAK) 0.3 mg/0.3 mL IJ SOAJ injection Inject 0.3 mLs (0.3 mg total) into the muscle as needed for up to 1 dose for anaphylaxis. 09/14/18   Dionne Bucy, MD  predniSONE (DELTASONE) 20 MG tablet Take 2 tablets (40 mg total) by mouth daily for 3 days. 09/15/18 09/18/18  Dionne Bucy, MD    Allergies Patient has no known allergies.  Family History  Problem Relation Age of Onset  . Healthy Mother   . Healthy Father     Social History Social History   Tobacco Use  . Smoking status: Never Smoker  . Smokeless tobacco: Never Used  Substance Use Topics  . Alcohol use: No   Alcohol/week: 0.0 standard drinks  . Drug use: No    Review of Systems  Constitutional: No fever. Eyes: No redness. ENT: Positive for throat discomfort. Cardiovascular: Denies chest pain. Respiratory: Denies shortness of breath. Gastrointestinal: Positive for vomiting.  Genitourinary: Negative for flank pain.  Musculoskeletal: Negative for back pain. Skin: Positive for urticaria. Neurological: Negative for headache.   ____________________________________________   PHYSICAL EXAM:  VITAL SIGNS: ED Triage Vitals  Enc Vitals Group     BP 09/14/18 1057 (!) 133/96     Pulse Rate 09/14/18 1057 100     Resp 09/14/18 1057 20     Temp 09/14/18 1057 98.3 F (36.8 C)     Temp Source 09/14/18 1057 Oral     SpO2 09/14/18 1057 98 %     Weight 09/14/18 1058 170 lb (77.1 kg)     Height 09/14/18 1058 5\' 4"  (1.626 m)     Head Circumference --      Peak Flow --      Pain Score 09/14/18 1058 2     Pain Loc --      Pain Edu? --      Excl. in GC? --     Constitutional: Alert and oriented.  Uncomfortable appearing but in no acute distress. Eyes: Conjunctivae are normal.  Head: Atraumatic. Nose: No congestion/rhinnorhea. Mouth/Throat: Mucous membranes are slightly dry.  Oropharynx clear with no swelling or pooled secretions.  No stridor. Neck: Normal range of motion.  Cardiovascular: Normal rate, regular rhythm. Grossly normal heart sounds.  Good peripheral circulation. Respiratory: Normal respiratory effort.  No retractions. Lungs CTAB. Gastrointestinal: Soft and nontender. No distention.  Genitourinary: No flank tenderness. Musculoskeletal: Extremities warm and well perfused.  Neurologic:  Normal speech and language. No gross focal neurologic deficits are appreciated.  Skin:  Skin is warm and dry.  Scattered faint urticarial type rash seen on extremities. Psychiatric: Mood and affect are normal. Speech and behavior are normal.  ____________________________________________   LABS  (all labs ordered are listed, but only abnormal results are displayed)  Labs Reviewed  BASIC METABOLIC PANEL - Abnormal; Notable for the following components:      Result Value   CO2 21 (*)    Glucose, Bld 128 (*)    Calcium 8.6 (*)    All other components within normal limits  CBC WITH DIFFERENTIAL/PLATELET - Abnormal; Notable for the following components:   WBC 12.6 (*)    Neutro Abs 10.5 (*)    All other components within normal limits  HEPATIC FUNCTION PANEL  LIPASE, BLOOD   ____________________________________________  EKG   ____________________________________________  RADIOLOGY    ____________________________________________   PROCEDURES  Procedure(s) performed: No  Procedures  Critical Care performed: No ____________________________________________   INITIAL IMPRESSION / ASSESSMENT AND PLAN / ED COURSE  Pertinent labs & imaging results that were available during my care of the patient were reviewed by me and considered in my medical decision making (see chart for details).  28 year old female with no significant medical history presents with symptoms concerning for an allergic reaction.  She initially had itching and urticaria as well as discomfort in her throat.  This improved last night with Benadryl but then returned today and was associated with nausea and vomiting.  On exam the patient is overall relatively well-appearing.  She has a borderline low blood pressure although given her habitus I suspect that this is close to her baseline.  Her other vital signs are normal.  Her oropharynx is clear.  She does have some faint urticarial type rash on her extremities which she states is a bit improved.  Overall presentation is consistent with likely allergic reaction.  We will give IV Zofran, Benadryl, and Solu-Medrol and observe for 1 to 2 hours.  There is no indication for epinephrine at this time.  ----------------------------------------- 2:43 PM on  09/14/2018 -----------------------------------------  The patient was feeling much better in terms of the itching and urticaria but still had some vomiting, bitter taste in her mouth, and epigastric abdominal pain.  This was consistent with GERD.  I gave a dose of Reglan and Pepcid.  We obtained labs which are within normal limits except for slightly elevated WBC count.  On reassessment, the patient is now feeling much better.  She has been in the ED for almost 4 hours.  She is stable for discharge home.  Return precautions given, and she expresses understanding.  I have prescribed an EpiPen and instructed her on its use, in addition to a course of prednisone. ____________________________________________   FINAL CLINICAL IMPRESSION(S) / ED DIAGNOSES  Final diagnoses:  Allergic reaction, initial encounter      NEW MEDICATIONS STARTED DURING THIS VISIT:  New Prescriptions   EPINEPHRINE (EPIPEN 2-PAK) 0.3 MG/0.3 ML IJ SOAJ INJECTION    Inject 0.3 mLs (0.3 mg total) into the muscle as needed for up to 1 dose for anaphylaxis.   PREDNISONE (DELTASONE) 20 MG TABLET    Take 2 tablets (40 mg total) by mouth daily for 3 days.     Note:  This document was prepared using Dragon voice recognition software and may include unintentional dictation errors.    Dionne Bucy, MD 09/14/18 1444

## 2018-09-14 NOTE — ED Triage Notes (Signed)
Patient presents to the ED with hives and swelling that began yesterday.  While patient was waiting in waiting room she began to feel swelling in her throat and vomited x 1.  Patient has rash between breasts and on left leg. Patient reports taking 2 benadryl last night.  Patient is unsure what she may be allergic to.  Occasionally clearing her throat during triage.

## 2018-09-14 NOTE — Discharge Instructions (Addendum)
Return to the ER for new, worsening, or persistent itching, rash or swelling, difficulty breathing or swallowing, persistent vomiting or abdominal pain, or any other new or worsening symptoms that concern you.  Take the prednisone as prescribed over the next 3 days.

## 2019-02-15 ENCOUNTER — Telehealth: Payer: Self-pay | Admitting: General Practice

## 2019-02-15 NOTE — Telephone Encounter (Signed)
Patient is calling to schedule new patient appt with Raquel Sarna Cb- [phone]

## 2019-02-15 NOTE — Telephone Encounter (Signed)
Pt changed her mind about needing this appt

## 2019-09-28 ENCOUNTER — Other Ambulatory Visit: Payer: Self-pay

## 2019-09-28 ENCOUNTER — Emergency Department
Admission: EM | Admit: 2019-09-28 | Discharge: 2019-09-28 | Disposition: A | Discharge status: Home / Self Care | Payer: BC Managed Care – PPO | Attending: Emergency Medicine | Admitting: Emergency Medicine

## 2019-09-28 ENCOUNTER — Emergency Department: Payer: BC Managed Care – PPO

## 2019-09-28 ENCOUNTER — Encounter: Payer: Self-pay | Admitting: Emergency Medicine

## 2019-09-28 DIAGNOSIS — M7989 Other specified soft tissue disorders: Secondary | ICD-10-CM

## 2019-09-28 DIAGNOSIS — L509 Urticaria, unspecified: Secondary | ICD-10-CM | POA: Insufficient documentation

## 2019-09-28 DIAGNOSIS — R2231 Localized swelling, mass and lump, right upper limb: Secondary | ICD-10-CM | POA: Diagnosis present

## 2019-09-28 LAB — CBC WITH DIFFERENTIAL/PLATELET
Abs Immature Granulocytes: 0.02 10*3/uL (ref 0.00–0.07)
Basophils Absolute: 0 10*3/uL (ref 0.0–0.1)
Basophils Relative: 0 %
Eosinophils Absolute: 0 10*3/uL (ref 0.0–0.5)
Eosinophils Relative: 1 %
HCT: 43.9 % (ref 36.0–46.0)
Hemoglobin: 15.1 g/dL — ABNORMAL HIGH (ref 12.0–15.0)
Immature Granulocytes: 0 %
Lymphocytes Relative: 26 %
Lymphs Abs: 2.1 10*3/uL (ref 0.7–4.0)
MCH: 29.8 pg (ref 26.0–34.0)
MCHC: 34.4 g/dL (ref 30.0–36.0)
MCV: 86.6 fL (ref 80.0–100.0)
Monocytes Absolute: 0.3 10*3/uL (ref 0.1–1.0)
Monocytes Relative: 4 %
Neutro Abs: 5.6 10*3/uL (ref 1.7–7.7)
Neutrophils Relative %: 69 %
Platelets: 261 10*3/uL (ref 150–400)
RBC: 5.07 MIL/uL (ref 3.87–5.11)
RDW: 11.9 % (ref 11.5–15.5)
WBC: 8.1 10*3/uL (ref 4.0–10.5)
nRBC: 0 % (ref 0.0–0.2)

## 2019-09-28 LAB — BASIC METABOLIC PANEL
Anion gap: 9 (ref 5–15)
BUN: 15 mg/dL (ref 6–20)
CO2: 27 mmol/L (ref 22–32)
Calcium: 9.3 mg/dL (ref 8.9–10.3)
Chloride: 101 mmol/L (ref 98–111)
Creatinine, Ser: 0.67 mg/dL (ref 0.44–1.00)
GFR calc Af Amer: >60 mL/min (ref >60)
GFR calc non Af Amer: >60 mL/min (ref >60)
Glucose, Bld: 103 mg/dL — ABNORMAL HIGH (ref 70–99)
Potassium: 3.7 mmol/L (ref 3.5–5.1)
Sodium: 137 mmol/L (ref 135–145)

## 2019-09-28 LAB — FIBRIN DERIVATIVES D-DIMER (ARMC ONLY): Fibrin derivatives D-dimer (ARMC): 7402.73 ng/mL (FEU) — ABNORMAL HIGH (ref 0.00–499.00)

## 2019-09-28 MED ORDER — DIPHENHYDRAMINE HCL 25 MG PO CAPS
50.0000 mg | ORAL_CAPSULE | Freq: Once | ORAL | Status: AC
  Administered 2019-09-28: 50 mg via ORAL
  Filled 2019-09-28: qty 2

## 2019-09-28 MED ORDER — PREDNISONE 50 MG PO TABS: ORAL_TABLET | ORAL | 0 refills | Status: DC

## 2019-09-28 MED ORDER — FAMOTIDINE 20 MG PO TABS: 20.0000 mg | ORAL_TABLET | Freq: Two times a day (BID) | ORAL | 1 refills | Status: DC

## 2019-09-28 MED ORDER — FAMOTIDINE 20 MG PO TABS
40.0000 mg | ORAL_TABLET | Freq: Once | ORAL | Status: AC
  Administered 2019-09-28: 40 mg via ORAL
  Filled 2019-09-28: qty 2

## 2019-09-28 MED ORDER — DEXAMETHASONE 10 MG/ML FOR PEDIATRIC ORAL USE
20.0000 mg | Freq: Once | INTRAMUSCULAR | Status: AC
  Administered 2019-09-28: 20 mg via ORAL
  Filled 2019-09-28: qty 2

## 2019-09-28 MED ORDER — DIPHENHYDRAMINE HCL 25 MG PO TABS: 25.0000 mg | ORAL_TABLET | Freq: Four times a day (QID) | ORAL | 0 refills | Status: DC | PRN

## 2019-09-28 NOTE — Discharge Instructions (Signed)
Take prednisone once daily. Take 25 mg of Benadryl every 6 hours. Take famotidine twice daily. Take medication until hives of the right upper extremity completely resolved. Anticipate a phone call from me tomorrow around 3:00 PM.

## 2019-09-28 NOTE — ED Notes (Signed)
Patient states her husband is coming to pick her up and should be here about now.

## 2019-09-28 NOTE — ED Triage Notes (Signed)
Pt to ED via POV c/o left arm swelling. Pts left arm is swollen and red, arm feels warm to touch. Pt states that she had swollen area just below her AC and it has spread down her arm into her hand. Pt denies injury. Pt has not had fever or chills. Pt is in NAD.

## 2019-09-28 NOTE — ED Provider Notes (Signed)
Emergency Department Provider Note  ____________________________________________  Time seen: Approximately 9:11 PM  I have reviewed the triage vital signs and the nursing notes.   HISTORY  Chief Complaint Arm Swelling   Historian Patient     HPI Wanda Wyatt is a 29 y.o. female presents to the emergency department with diffuse hives of the right upper extremity.  Patient reports that erythema started last night around 4:00 AM.  She noticed symptoms when her right upper extremity became pruritic.  She states that erythema has worsened and she is also noticed new swelling along the right forearm and right wrist.  She states that she has a history of frequent allergic reactions but is unsure if she has ever had a similar reaction in the past.  She denies shortness of breath, chest tightness, cough, emesis or diarrhea.  She denies fever and chills at home.  No recent abrasions or lacerations in the right upper extremity.  She denies use of contraceptives, recent travel, prolonged immobilization, recent surgery, daily smoking or prior DVT or PE.   History reviewed. No pertinent past medical history.   Immunizations up to date:  Yes.     History reviewed. No pertinent past medical history.  Patient Active Problem List   Diagnosis Date Noted  . Abdominal pain in pregnancy, antepartum 11/21/2015    History reviewed. No pertinent surgical history.  Prior to Admission medications   Medication Sig Start Date End Date Taking? Authorizing Provider  EPINEPHrine (EPIPEN 2-PAK) 0.3 mg/0.3 mL IJ SOAJ injection Inject 0.3 mLs (0.3 mg total) into the muscle as needed for up to 1 dose for anaphylaxis. 09/14/18   Dionne Bucy, MD    Allergies Patient has no known allergies.  Family History  Problem Relation Age of Onset  . Healthy Mother   . Healthy Father     Social History Social History   Tobacco Use  . Smoking status: Never Smoker  . Smokeless tobacco: Never Used   Substance Use Topics  . Alcohol use: No    Alcohol/week: 0.0 standard drinks  . Drug use: No     Review of Systems  Constitutional: No fever/chills Eyes:  No discharge ENT: No upper respiratory complaints. Respiratory: no cough. No SOB/ use of accessory muscles to breath Gastrointestinal:   No nausea, no vomiting.  No diarrhea.  No constipation. Musculoskeletal: Negative for musculoskeletal pain. Skin: Patient has erythema of right right upper extremity.     ____________________________________________   PHYSICAL EXAM:  VITAL SIGNS: ED Triage Vitals  Enc Vitals Group     BP 09/28/19 1807 129/81     Pulse Rate 09/28/19 1807 82     Resp 09/28/19 1807 16     Temp 09/28/19 1807 98.4 F (36.9 C)     Temp Source 09/28/19 1807 Oral     SpO2 09/28/19 1807 100 %     Weight 09/28/19 1808 170 lb (77.1 kg)     Height 09/28/19 1808 5\' 5"  (1.651 m)     Head Circumference --      Peak Flow --      Pain Score 09/28/19 1808 4     Pain Loc --      Pain Edu? --      Excl. in GC? --      Constitutional: Alert and oriented. Well appearing and in no acute distress. Eyes: Conjunctivae are normal. PERRL. EOMI. Head: Atraumatic. Cardiovascular: Normal rate, regular rhythm. Normal S1 and S2.  Good peripheral circulation. Respiratory: Normal respiratory  effort without tachypnea or retractions. Lungs CTAB. Good air entry to the bases with no decreased or absent breath sounds Gastrointestinal: Bowel sounds x 4 quadrants. Soft and nontender to palpation. No guarding or rigidity. No distention. Musculoskeletal: Full range of motion to all extremities. No obvious deformities noted Neurologic:  Normal for age. No gross focal neurologic deficits are appreciated.  Skin: Patient has erythema of the right upper extremity with 1+ pitting edema.  Erythema is continuous and spans from mid upper arm to right hand. Psychiatric: Mood and affect are normal for age. Speech and behavior are normal.    ____________________________________________   LABS (all labs ordered are listed, but only abnormal results are displayed)  Labs Reviewed  CBC WITH DIFFERENTIAL/PLATELET - Abnormal; Notable for the following components:      Result Value   Hemoglobin 15.1 (*)    All other components within normal limits  BASIC METABOLIC PANEL - Abnormal; Notable for the following components:   Glucose, Bld 103 (*)    All other components within normal limits  FIBRIN DERIVATIVES D-DIMER ([facility] ONLY) - Abnormal; Notable for the following components:   Fibrin derivatives D-dimer [facility]) 7,402.73 (*)    All other components within normal limits   ____________________________________________  EKG   ____________________________________________  RADIOLOGY Unk Pinto, personally viewed and evaluated these images (plain radiographs) as part of my medical decision making, as well as reviewing the written report by the radiologist.    US Venous Img Upper Uni Right(DVT)  Result Date: 09/28/2019 CLINICAL DATA:  Right upper extremity redness and swelling today. EXAM: RIGHT UPPER EXTREMITY VENOUS DOPPLER ULTRASOUND TECHNIQUE: Gray-scale sonography with graded compression, as well as color Doppler and duplex ultrasound were performed to evaluate the upper extremity deep venous system from the level of the subclavian vein and including the jugular, axillary, basilic, radial, ulnar and upper cephalic vein. Spectral Doppler was utilized to evaluate flow at rest and with distal augmentation maneuvers. COMPARISON:  None. FINDINGS: Contralateral Subclavian Vein: Respiratory phasicity is normal and symmetric with the symptomatic side. No evidence of thrombus. Normal compressibility. Internal Jugular Vein: No evidence of thrombus. Normal compressibility, respiratory phasicity and response to augmentation. Subclavian Vein: No evidence of thrombus. Normal compressibility, respiratory phasicity and response to  augmentation. Axillary Vein: No evidence of thrombus. Normal compressibility, respiratory phasicity and response to augmentation. Cephalic Vein: No evidence of thrombus. Normal compressibility, respiratory phasicity and response to augmentation. Basilic Vein: No evidence of thrombus. Normal compressibility, respiratory phasicity and response to augmentation. Brachial Veins: No evidence of thrombus. Normal compressibility, respiratory phasicity and response to augmentation. Radial Veins: No evidence of thrombus. Normal compressibility, respiratory phasicity and response to augmentation. Ulnar Veins: No evidence of thrombus. Normal compressibility, respiratory phasicity and response to augmentation. Venous Reflux:  None visualized. Other Findings: Subcutaneous edema in the area of swelling in the antecubital fossa. IMPRESSION: No evidence of DVT within the right upper extremity. Electronically Signed   By: Keith Rake M.D.   On: 09/28/2019 21:21    ____________________________________________    PROCEDURES  Procedure(s) performed:     Procedures     Medications  dexamethasone (DECADRON) 10 MG/ML injection for Pediatric ORAL use 20 mg (20 mg Oral Given 09/28/19 2134)  diphenhydrAMINE (BENADRYL) capsule 50 mg (50 mg Oral Given 09/28/19 2135)  famotidine (PEPCID) tablet 40 mg (40 mg Oral Given 09/28/19 2137)     ____________________________________________   INITIAL IMPRESSION / ASSESSMENT AND PLAN / ED COURSE  Pertinent labs & imaging results  that were available during my care of the patient were reviewed by me and considered in my medical decision making (see chart for details).      Assessment and Plan:  Arm pain Right upper extremity edema Arm swelling 29 year old female presents to the emergency department with right upper extremity edema that started approximately 4:00 AM last night.  Vital signs were reviewed at triage and were reassuring without fever.  No leukocytosis  on CBC.  Patient had a significantly elevated D-dimer.  Differential diagnosis included urticaria versus cellulitis versus DVT.  Venous ultrasound revealed no evidence of thrombus formation.  Absence of leukocytosis decreases suspicion for cellulitis.  Will give allergy medications and will reassess.  Patient's right upper extremity erythema had improved with antiallergy medications.  Patient was discharged home with Benadryl,, prednisone and Pepcid.  Return precautions were given to return with new or worsening symptoms.  All patient questions were answered. ____________________________________________  FINAL CLINICAL IMPRESSION(S) / ED DIAGNOSES  Final diagnoses:  Arm swelling      NEW MEDICATIONS STARTED DURING THIS VISIT:  ED Discharge Orders    None          This chart was dictated using voice recognition software/Dragon. Despite best efforts to proofread, errors can occur which can change the meaning. Any change was purely unintentional.     Gasper Lloyd 09/28/19 2307    Phineas Semen, MD 09/28/19 [phone]

## 2019-09-28 NOTE — ED Notes (Signed)
Patient has called her mother, who agreed to come and pick her up when discharged.

## 2019-09-28 NOTE — ED Notes (Signed)
Pt to US.

## 2020-08-22 IMAGING — US US EXTREM  UP VENOUS*R*
1 series · 13 of 24 positions shown · non-contrast
Comparison: None.

CLINICAL DATA: Right upper extremity redness and swelling today.



[Series 1: us venous img upper uni left (dvt) · portal-venous · 13 of 37 slices shown]
[im 1/37]
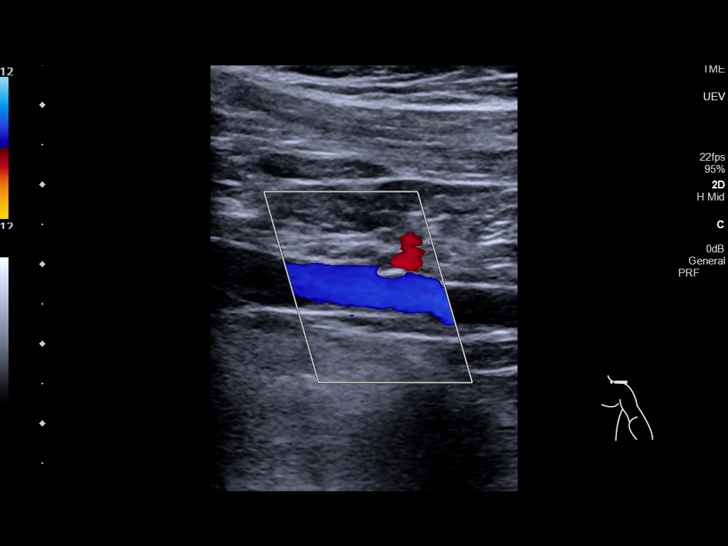
[im 4/37]
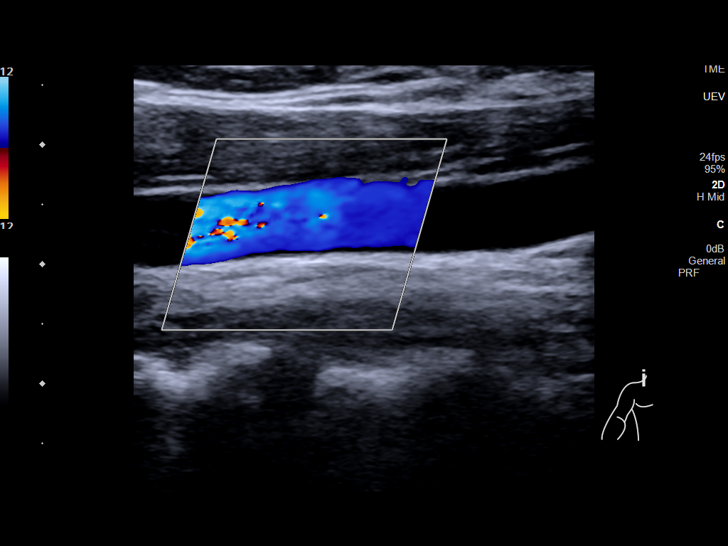
[im 7/37]
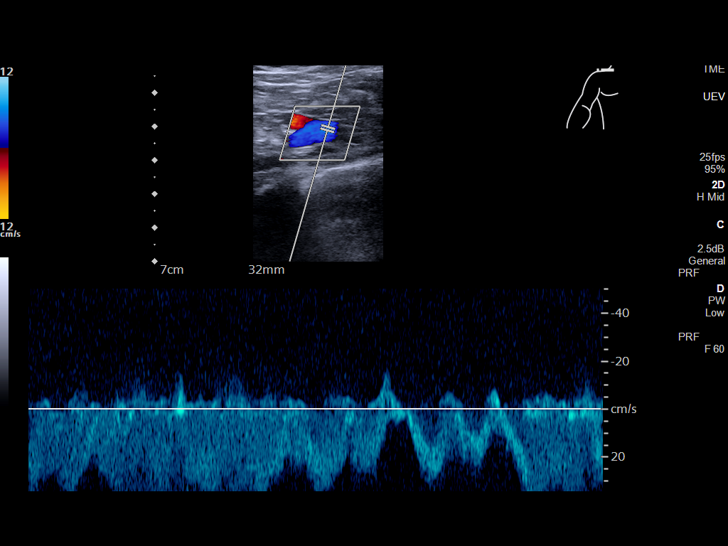
[im 10/37]
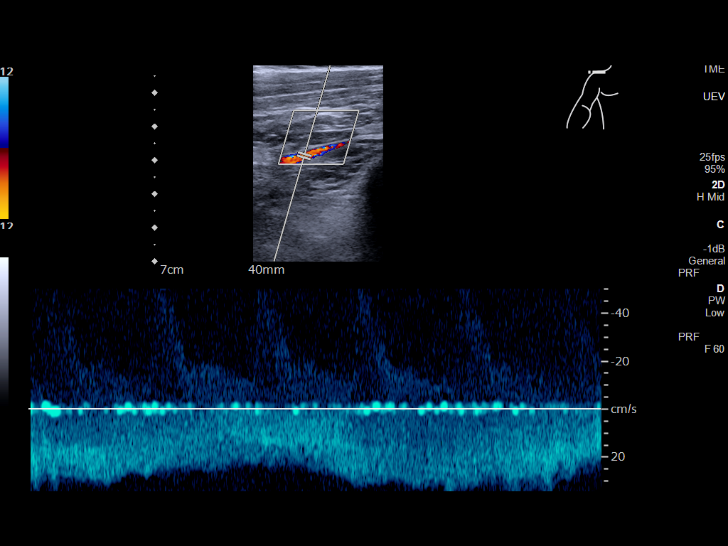
[im 13/37]
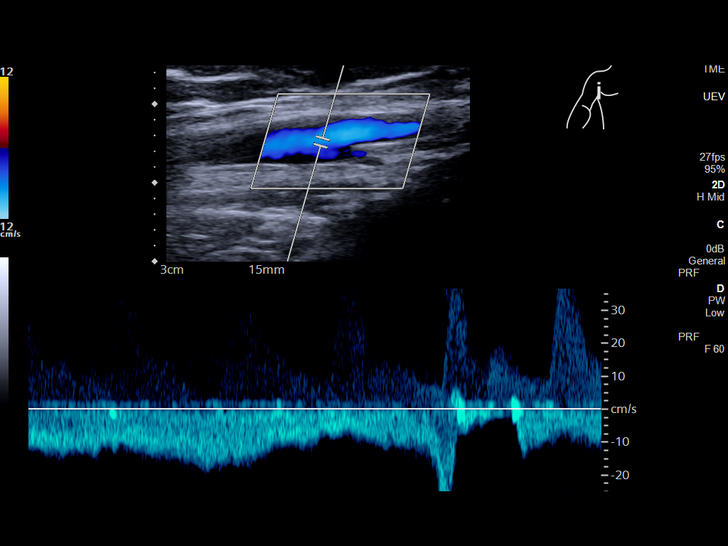
[im 16/37]
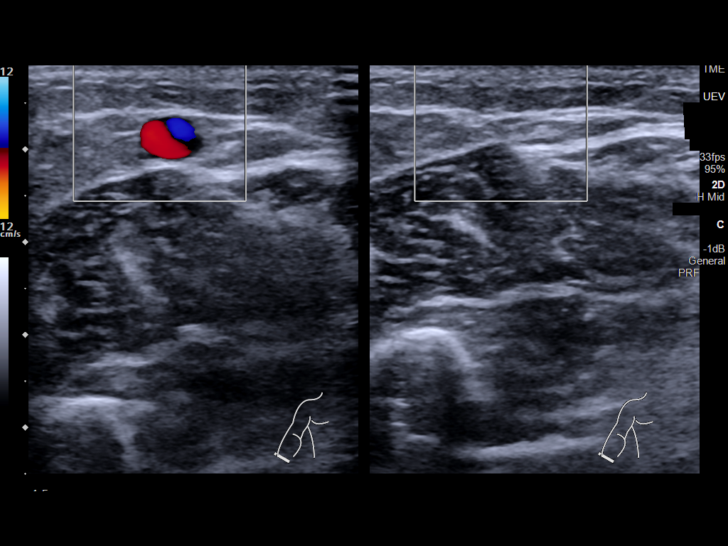
[im 19/37]
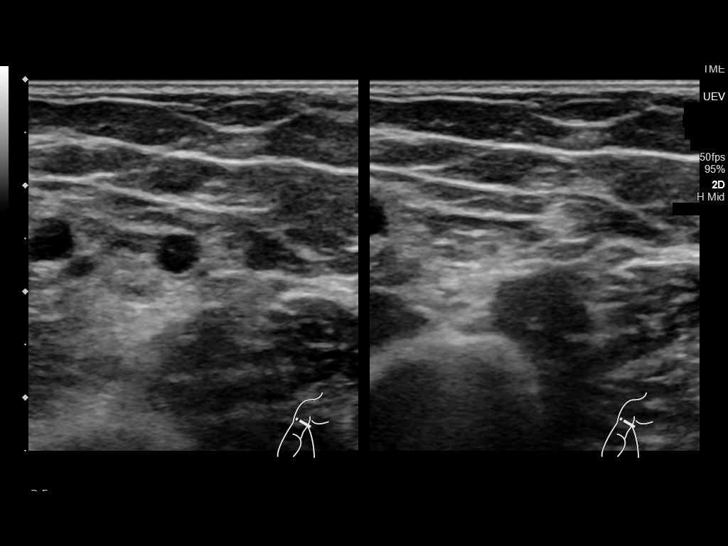
[im 21/37]
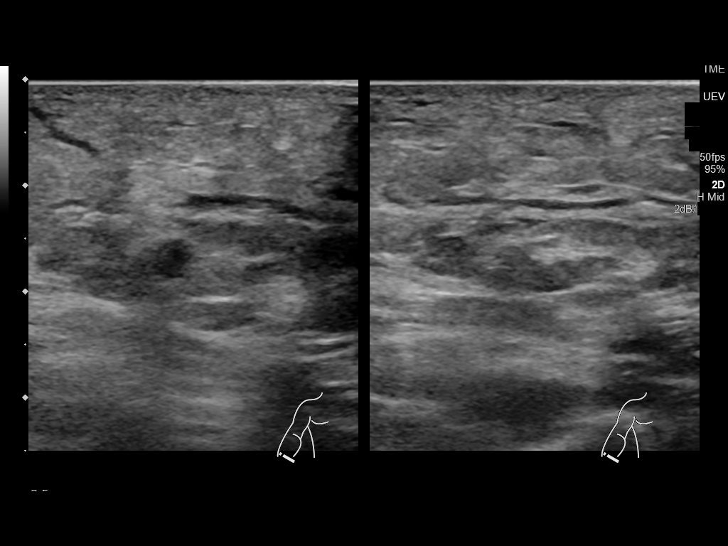
[im 24/37]
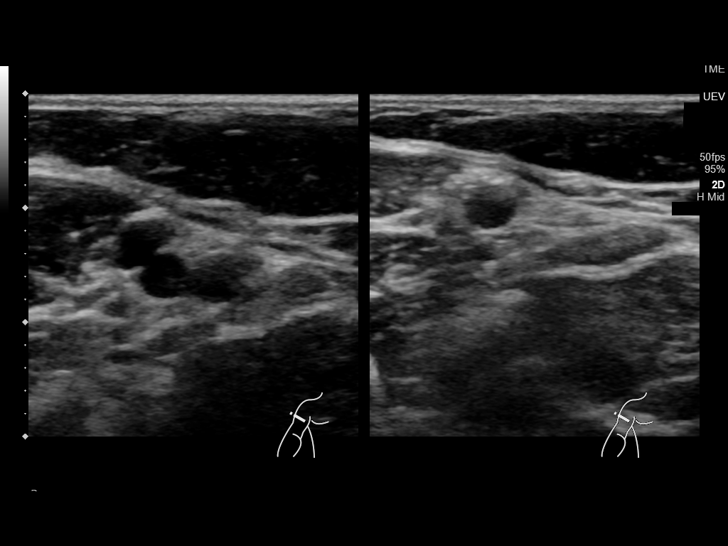
[im 27/37]
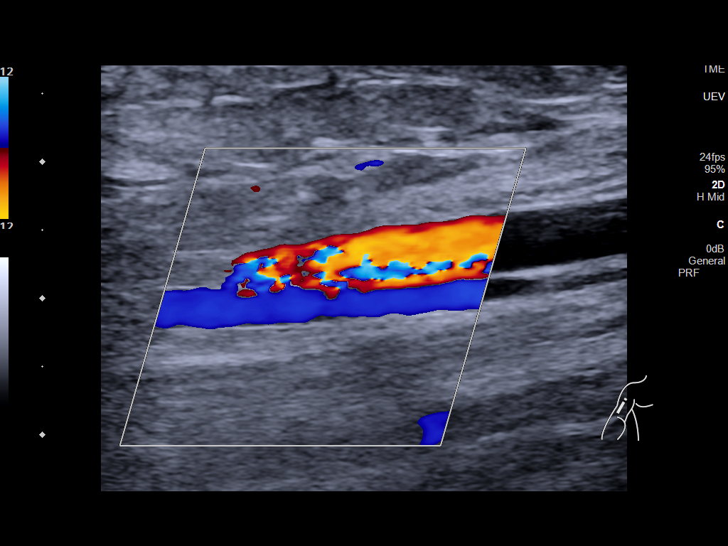
[im 30/37]
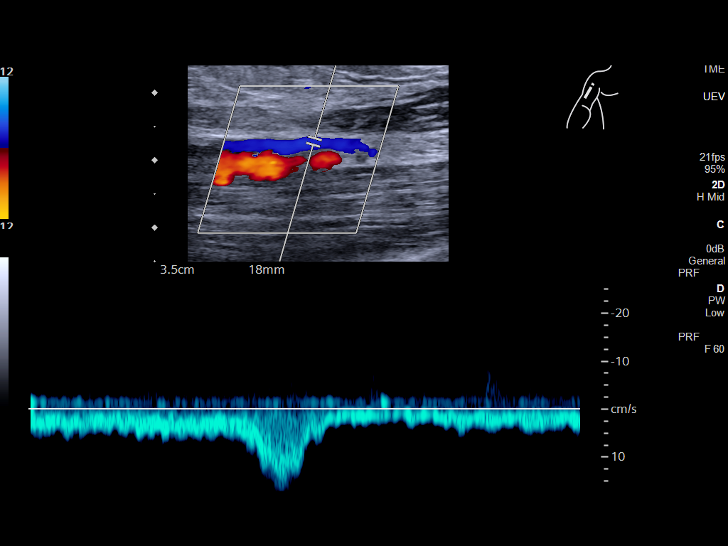
[im 33/37]
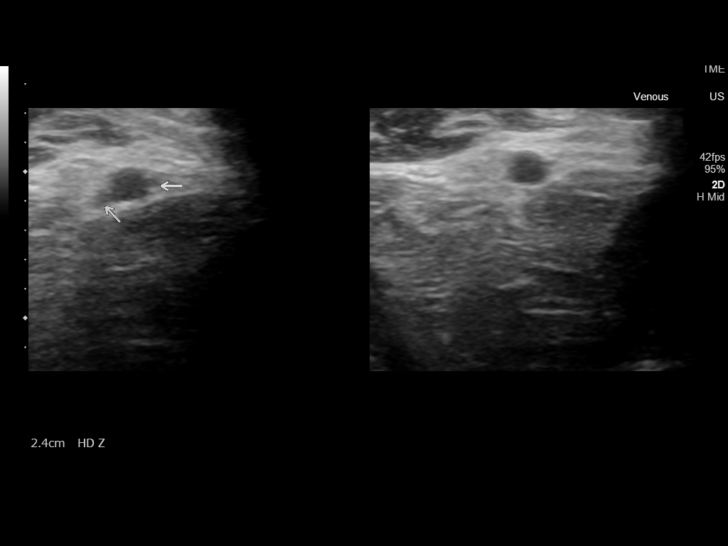
[im 37/37]
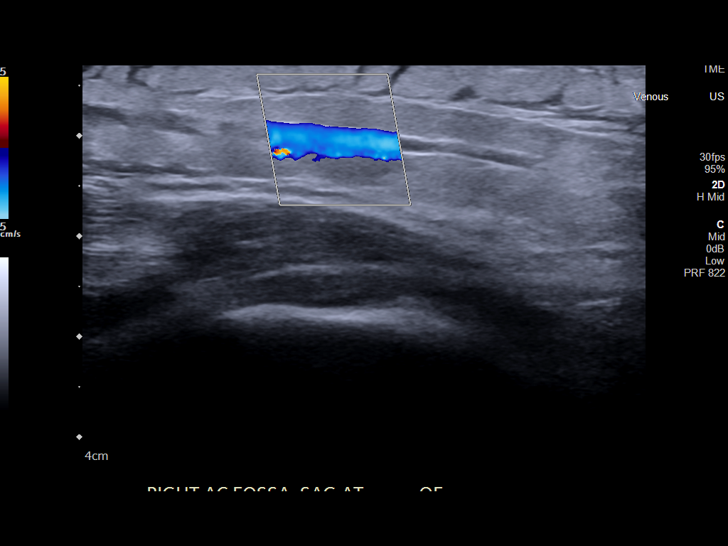

[13 of 24 positions shown; findings below may reference images not displayed]

FINDINGS: Contralateral Subclavian Vein: Respiratory phasicity is normal and
symmetric with the symptomatic side. No evidence of thrombus. Normal
compressibility.

Internal Jugular Vein: No evidence of thrombus. Normal
compressibility, respiratory phasicity and response to augmentation.

Subclavian Vein: No evidence of thrombus. Normal compressibility,
respiratory phasicity and response to augmentation.

Axillary Vein: No evidence of thrombus. Normal compressibility,
respiratory phasicity and response to augmentation.

Cephalic Vein: No evidence of thrombus. Normal compressibility,
respiratory phasicity and response to augmentation.

Basilic Vein: No evidence of thrombus. Normal compressibility,
respiratory phasicity and response to augmentation.

Brachial Veins: No evidence of thrombus. Normal compressibility,
respiratory phasicity and response to augmentation.

Radial Veins: No evidence of thrombus. Normal compressibility,
respiratory phasicity and response to augmentation.

Ulnar Veins: No evidence of thrombus. Normal compressibility,
respiratory phasicity and response to augmentation.

Venous Reflux:  None visualized.

Other Findings: Subcutaneous edema in the area of swelling in the
antecubital fossa.
IMPRESSION: No evidence of DVT within the right upper extremity.

## 2021-09-24 ENCOUNTER — Emergency Department
Admission: EM | Admit: 2021-09-24 | Discharge: 2021-09-24 | Disposition: A | Discharge status: Home / Self Care | Payer: BC Managed Care – PPO | Attending: Emergency Medicine | Admitting: Emergency Medicine

## 2021-09-24 ENCOUNTER — Encounter: Payer: Self-pay | Admitting: Intensive Care

## 2021-09-24 ENCOUNTER — Other Ambulatory Visit: Payer: Self-pay

## 2021-09-24 DIAGNOSIS — R112 Nausea with vomiting, unspecified: Secondary | ICD-10-CM | POA: Diagnosis not present

## 2021-09-24 DIAGNOSIS — R109 Unspecified abdominal pain: Secondary | ICD-10-CM | POA: Insufficient documentation

## 2021-09-24 DIAGNOSIS — R21 Rash and other nonspecific skin eruption: Secondary | ICD-10-CM | POA: Diagnosis not present

## 2021-09-24 LAB — COMPREHENSIVE METABOLIC PANEL
ALT: 23 U/L (ref 0–44)
AST: 24 U/L (ref 15–41)
Albumin: 4.3 g/dL (ref 3.5–5.0)
Alkaline Phosphatase: 81 U/L (ref 38–126)
Anion gap: 12 (ref 5–15)
BUN: 13 mg/dL (ref 6–20)
CO2: 24 mmol/L (ref 22–32)
Calcium: 9.5 mg/dL (ref 8.9–10.3)
Chloride: 103 mmol/L (ref 98–111)
Creatinine, Ser: 0.64 mg/dL (ref 0.44–1.00)
GFR, Estimated: >60 mL/min (ref >60)
Glucose, Bld: 119 mg/dL — ABNORMAL HIGH (ref 70–99)
Potassium: 3.8 mmol/L (ref 3.5–5.1)
Sodium: 139 mmol/L (ref 135–145)
Total Bilirubin: 0.9 mg/dL (ref 0.3–1.2)
Total Protein: 7.9 g/dL (ref 6.5–8.1)

## 2021-09-24 LAB — LIPASE, BLOOD: Lipase: 31 U/L (ref 11–51)

## 2021-09-24 LAB — CBC
HCT: 49.7 % — ABNORMAL HIGH (ref 36.0–46.0)
Hemoglobin: 16.5 g/dL — ABNORMAL HIGH (ref 12.0–15.0)
MCH: 27.8 pg (ref 26.0–34.0)
MCHC: 33.2 g/dL (ref 30.0–36.0)
MCV: 83.8 fL (ref 80.0–100.0)
Platelets: 356 10*3/uL (ref 150–400)
RBC: 5.93 MIL/uL — ABNORMAL HIGH (ref 3.87–5.11)
RDW: 12.2 % (ref 11.5–15.5)
WBC: 12.6 10*3/uL — ABNORMAL HIGH (ref 4.0–10.5)
nRBC: 0 % (ref 0.0–0.2)

## 2021-09-24 MED ORDER — ONDANSETRON HCL 4 MG PO TABS: 4.0000 mg | ORAL_TABLET | Freq: Three times a day (TID) | ORAL | 0 refills | Status: DC | PRN

## 2021-09-24 MED ORDER — DICYCLOMINE HCL 10 MG PO CAPS
20.0000 mg | ORAL_CAPSULE | Freq: Once | ORAL | Status: AC
  Administered 2021-09-24: 20 mg via ORAL
  Filled 2021-09-24: qty 2

## 2021-09-24 MED ORDER — DICYCLOMINE HCL 10 MG PO CAPS: 10.0000 mg | ORAL_CAPSULE | Freq: Three times a day (TID) | ORAL | 0 refills | Status: DC | PRN

## 2021-09-24 MED ORDER — ONDANSETRON 4 MG PO TBDP
4.0000 mg | ORAL_TABLET | Freq: Once | ORAL | Status: AC
  Administered 2021-09-24: 4 mg via ORAL
  Filled 2021-09-24: qty 1

## 2021-09-24 MED ORDER — SODIUM CHLORIDE 0.9 % IV BOLUS
1000.0000 mL | Freq: Once | INTRAVENOUS | Status: AC
  Administered 2021-09-24: 1000 mL via INTRAVENOUS

## 2021-09-24 NOTE — ED Triage Notes (Signed)
Patient c/o abdominal pain with emesis and mild diarrhea. Also reports red splotchy skin on chest.  ?

## 2021-09-24 NOTE — Discharge Instructions (Signed)
Please seek medical attention for any high fevers, chest pain, shortness of breath, change in behavior, persistent vomiting, bloody stool or any other new or concerning symptoms.  

## 2021-09-24 NOTE — ED Provider Notes (Signed)
? ?[facility] ?Provider Note ? ? ? Event Date/Time  ? First MD Initiated Contact with Patient 09/24/21 1452   ?  (approximate) ? ? ?History  ? ?Abdominal Pain and Emesis ? ? ?HPI ? ?Wanda Wyatt is a 31 y.o. female  who presents to the emergency department today because of concern for abdominal pain, nausea and vomiting. The patient states that her symptoms started abruptly  this morning around 3 am. She had multiple episodes of vomiting. Did have some abdominal pain in the center part of her stomach when the vomiting would occur. As she was getting ready to come to the emergency department she did notice some rash over her upper chest and face. The patient says she feels better after the nausea medication given in triage. The patient denies any recent unusual ingestions. Denies any known sick contacts.   ? ? ?Physical Exam  ? ?Triage Vital Signs: ?ED Triage Vitals  ?Enc Vitals Group  ?   BP 09/24/21 1306 (!) 122/91  ?   Pulse Rate 09/24/21 1306 (!) 105  ?   Resp 09/24/21 1306 18  ?   Temp 09/24/21 1306 99 ?F (37.2 ?C)  ?   Temp Source 09/24/21 1306 Oral  ?   SpO2 09/24/21 1306 94 %  ?   Weight 09/24/21 1307 180 lb (81.6 kg)  ?   Height 09/24/21 1307 5\' 5"  (1.651 m)  ?   Head Circumference --   ?   Peak Flow --   ?   Pain Score 09/24/21 1307 5  ? ?Most recent vital signs: ?Vitals:  ? 09/24/21 1306  ?BP: (!) 122/91  ?Pulse: (!) 105  ?Resp: 18  ?Temp: 99 ?F (37.2 ?C)  ?SpO2: 94%  ? ? ?General: Awake, no distress.  ?CV:  Good peripheral perfusion. Regular rate and rhythm. ?Resp:  Normal effort. Clear to auscultation. ?Abd:  No distention. Diffusely tender to palpation.  ? ? ?ED Results / Procedures / Treatments  ? ?Labs ?(all labs ordered are listed, but only abnormal results are displayed) ?Labs Reviewed  ?COMPREHENSIVE METABOLIC PANEL - Abnormal; Notable for the following components:  ?    Result Value  ? Glucose, Bld 119 (*)   ? All other components within normal limits  ?CBC - Abnormal;  Notable for the following components:  ? WBC 12.6 (*)   ? RBC 5.93 (*)   ? Hemoglobin 16.5 (*)   ? HCT 49.7 (*)   ? All other components within normal limits  ?LIPASE, BLOOD  ?URINALYSIS, ROUTINE W REFLEX MICROSCOPIC  ?POC URINE PREG, ED  ? ? ? ?EKG ? ?None ? ? ?RADIOLOGY ?None ? ?PROCEDURES: ? ?Critical Care performed: No ? ?Procedures ? ? ?MEDICATIONS ORDERED IN ED: ?Medications  ?ondansetron (ZOFRAN-ODT) disintegrating tablet 4 mg (4 mg Oral Given 09/24/21 1315)  ? ? ? ?IMPRESSION / MDM / ASSESSMENT AND PLAN / ED COURSE  ?I reviewed the triage vital signs and the nursing notes. ?             ?               ? ?Differential diagnosis includes, but is not limited to, gastroenteritis, hepatitis, pancreatitis. ? ?Patient presented to the emergency department today because of concerns for nausea vomiting with some abdominal discomfort.  On exam patient without any focal tenderness.  Blood work here with very mild leukocytosis and no elevation of the LFTs or lipase.  Patient is afebrile.  Initially slightly  tachycardic although this did improve with IV fluids.  Patient stated she felt better after IV fluids and medication.  At this time I do think patient likely suffering from gastroenteritis type picture.  We will plan on discharging home with medication to help with symptoms.  Discussed return precautions. ? ?FINAL CLINICAL IMPRESSION(S) / ED DIAGNOSES  ? ?Final diagnoses:  ?Nausea and vomiting, unspecified vomiting type  ? ? ? ?Rx / DC Orders  ? ?ED Discharge Orders   ? ?      Ordered  ?  dicyclomine (BENTYL) 10 MG capsule  3 times daily PRN       ? 09/24/21 1736  ?  ondansetron (ZOFRAN) 4 MG tablet  Every 8 hours PRN       ? 09/24/21 1736  ? ?  ?  ? ?  ? ? ? ?Note:  This document was prepared using Dragon voice recognition software and may include unintentional dictation errors. ? ?  ?Phineas Semen, MD ?09/24/21 1824 ? ?

## 2022-05-16 ENCOUNTER — Emergency Department: Payer: Medicaid Other

## 2022-05-16 ENCOUNTER — Emergency Department
Admission: EM | Admit: 2022-05-16 | Discharge: 2022-05-17 | Disposition: A | Discharge status: Home / Self Care | Payer: Medicaid Other | Attending: Emergency Medicine | Admitting: Emergency Medicine

## 2022-05-16 ENCOUNTER — Encounter: Payer: Self-pay | Admitting: Radiology

## 2022-05-16 DIAGNOSIS — Z3A01 Less than 8 weeks gestation of pregnancy: Secondary | ICD-10-CM | POA: Insufficient documentation

## 2022-05-16 DIAGNOSIS — O26891 Other specified pregnancy related conditions, first trimester: Secondary | ICD-10-CM | POA: Diagnosis not present

## 2022-05-16 DIAGNOSIS — K529 Noninfective gastroenteritis and colitis, unspecified: Secondary | ICD-10-CM | POA: Insufficient documentation

## 2022-05-16 DIAGNOSIS — O0289 Other abnormal products of conception: Secondary | ICD-10-CM | POA: Diagnosis not present

## 2022-05-16 DIAGNOSIS — O26899 Other specified pregnancy related conditions, unspecified trimester: Secondary | ICD-10-CM

## 2022-05-16 LAB — COMPREHENSIVE METABOLIC PANEL
ALT: 12 U/L (ref 0–44)
AST: 15 U/L (ref 15–41)
Albumin: 3.9 g/dL (ref 3.5–5.0)
Alkaline Phosphatase: 60 U/L (ref 38–126)
Anion gap: 11 (ref 5–15)
BUN: 11 mg/dL (ref 6–20)
CO2: 16 mmol/L — ABNORMAL LOW (ref 22–32)
Calcium: 9.1 mg/dL (ref 8.9–10.3)
Chloride: 111 mmol/L (ref 98–111)
Creatinine, Ser: 0.59 mg/dL (ref 0.44–1.00)
GFR, Estimated: >60 mL/min (ref >60)
Glucose, Bld: 134 mg/dL — ABNORMAL HIGH (ref 70–99)
Potassium: 3.7 mmol/L (ref 3.5–5.1)
Sodium: 138 mmol/L (ref 135–145)
Total Bilirubin: 0.9 mg/dL (ref 0.3–1.2)
Total Protein: 7.5 g/dL (ref 6.5–8.1)

## 2022-05-16 LAB — LIPASE, BLOOD: Lipase: 42 U/L (ref 11–51)

## 2022-05-16 LAB — CBC
HCT: 49.5 % — ABNORMAL HIGH (ref 36.0–46.0)
Hemoglobin: 16.9 g/dL — ABNORMAL HIGH (ref 12.0–15.0)
MCH: 28.2 pg (ref 26.0–34.0)
MCHC: 34.1 g/dL (ref 30.0–36.0)
MCV: 82.6 fL (ref 80.0–100.0)
Platelets: 352 10*3/uL (ref 150–400)
RBC: 5.99 MIL/uL — ABNORMAL HIGH (ref 3.87–5.11)
RDW: 12.5 % (ref 11.5–15.5)
WBC: 13.7 10*3/uL — ABNORMAL HIGH (ref 4.0–10.5)
nRBC: 0 % (ref 0.0–0.2)

## 2022-05-16 LAB — ABO/RH: ABO/RH(D): B POS

## 2022-05-16 LAB — URINALYSIS, ROUTINE W REFLEX MICROSCOPIC
Bilirubin Urine: NEGATIVE
Glucose, UA: NEGATIVE mg/dL
Hgb urine dipstick: NEGATIVE
Ketones, ur: 80 mg/dL — AB
Nitrite: NEGATIVE
Protein, ur: 100 mg/dL — AB
Specific Gravity, Urine: 1.032 — ABNORMAL HIGH (ref 1.005–1.030)
pH: 5 (ref 5.0–8.0)

## 2022-05-16 LAB — POC URINE PREG, ED: Preg Test, Ur: POSITIVE — AB

## 2022-05-16 LAB — HCG, QUANTITATIVE, PREGNANCY: hCG, Beta Chain, Quant, S: 15502 m[IU]/mL — ABNORMAL HIGH (ref <5)

## 2022-05-16 MED ORDER — SODIUM CHLORIDE 0.9 % IV SOLN: Freq: Once | INTRAVENOUS | Status: AC

## 2022-05-16 MED ORDER — ONDANSETRON 4 MG PO TBDP
4.0000 mg | ORAL_TABLET | Freq: Once | ORAL | Status: AC | PRN
  Administered 2022-05-16: 4 mg via ORAL
  Filled 2022-05-16: qty 1

## 2022-05-16 MED ORDER — MORPHINE SULFATE (PF) 2 MG/ML IV SOLN
2.0000 mg | Freq: Once | INTRAVENOUS | Status: AC
  Administered 2022-05-16: 2 mg via INTRAVENOUS
  Filled 2022-05-16: qty 1

## 2022-05-16 MED ORDER — ONDANSETRON HCL 4 MG/2ML IJ SOLN
4.0000 mg | Freq: Once | INTRAMUSCULAR | Status: AC
  Administered 2022-05-16: 4 mg via INTRAVENOUS
  Filled 2022-05-16: qty 2

## 2022-05-16 NOTE — ED Triage Notes (Signed)
Abd pain since last night with vomiting. Pt advised that she is not able to keep anything down.

## 2022-05-16 NOTE — ED Provider Notes (Signed)
[facility] Provider Note    Event Date/Time   First MD Initiated Contact with Patient 05/16/22 1943     (approximate)   History   Abdominal Pain   HPI  Wanda Wyatt is a 31 y.o. female with no history of abdominal surgery who presents with complaints of lower abdominal pain.  Patient reports 1 day of lower abdominal pain with nausea and vomiting.  She denies vaginal bleeding.  No dysuria although she reports it can be uncomfortable to urinate because she has some pressure in the area.  No fevers.  Has not take anything for this.     Physical Exam   Triage Vital Signs: ED Triage Vitals [05/16/22 1844]  Enc Vitals Group     BP (!) 124/98     Pulse Rate (!) 123     Resp 18     Temp 97.6 F (36.4 C)     Temp Source Oral     SpO2 97 %     Weight 81.6 kg (180 lb)     Height      Head Circumference      Peak Flow      Pain Score 7     Pain Loc      Pain Edu?      Excl. in GC?     Most recent vital signs: Vitals:   05/17/22 0233 05/17/22 0500  BP: 108/77 110/70  Pulse: 86 88  Resp: 16 16  Temp: 98.1 F (36.7 C) 98.4 F (36.9 C)  SpO2: 99% 100%     General: Awake, uncomfortable CV:  Good peripheral perfusion.  Resp:  Normal effort.  Abd:  No distention.  Mild tenderness in the lower abdomen Other:     ED Results / Procedures / Treatments   Labs (all labs ordered are listed, but only abnormal results are displayed) Labs Reviewed  COMPREHENSIVE METABOLIC PANEL - Abnormal; Notable for the following components:      Result Value   CO2 16 (*)    Glucose, Bld 134 (*)    All other components within normal limits  CBC - Abnormal; Notable for the following components:   WBC 13.7 (*)    RBC 5.99 (*)    Hemoglobin 16.9 (*)    HCT 49.5 (*)    All other components within normal limits  URINALYSIS, ROUTINE W REFLEX MICROSCOPIC - Abnormal; Notable for the following components:   Color, Urine YELLOW (*)    APPearance HAZY (*)     Specific Gravity, Urine 1.032 (*)    Ketones, ur 80 (*)    Protein, ur 100 (*)    Leukocytes,Ua TRACE (*)    Bacteria, UA RARE (*)    All other components within normal limits  HCG, QUANTITATIVE, PREGNANCY - Abnormal; Notable for the following components:   hCG, Beta Chain, Quant, S 15,502 (*)    All other components within normal limits  POC URINE PREG, ED - Abnormal; Notable for the following components:   Preg Test, Ur POSITIVE (*)    All other components within normal limits  LIPASE, BLOOD  ABO/RH     EKG     RADIOLOGY Ultrasound pending    PROCEDURES:  Critical Care performed:   Procedures   MEDICATIONS ORDERED IN ED: Medications  ondansetron (ZOFRAN-ODT) disintegrating tablet 4 mg (4 mg Oral Given 05/16/22 1849)  morphine (PF) 2 MG/ML injection 2 mg (2 mg Intravenous Given 05/16/22 2042)  ondansetron (ZOFRAN) injection 4 mg (  4 mg Intravenous Given 05/16/22 2042)  0.9 %  sodium chloride infusion (0 mLs Intravenous Stopped 05/16/22 2230)     IMPRESSION / MDM / ASSESSMENT AND PLAN / ED COURSE  I reviewed the triage vital signs and the nursing notes. Patient's presentation is most consistent with acute presentation with potential threat to life or bodily function.  Patient presents with lower abdominal pain as detailed above, she is tachycardic and uncomfortable appearing.  Her urine pregnancy test is positive.  Differential includes ectopic pregnancy, appendicitis, diverticulitis, UTI  hCG added, will obtain ultrasound  White blood cell count is mildly elevated which is nonspecific.  Urinalysis likely contaminated not consistent with UTI  Contacted by radiologist who notes IUP but moderate volume free fluid in the pelvis, recommends MR abdomen/pelvis  Asked my colleague to follow-up on these results have discussed plan with the patient  After review, MRI was consistent with enteritis and patient was discharged      FINAL CLINICAL IMPRESSION(S) / ED  DIAGNOSES   Final diagnoses:  Abdominal pain affecting pregnancy  Enteritis     Rx / DC Orders   ED Discharge Orders          Ordered    ondansetron (ZOFRAN-ODT) 4 MG disintegrating tablet  Every 8 hours PRN        05/17/22 0405             Note:  This document was prepared using Dragon voice recognition software and may include unintentional dictation errors.   Jene Every, MD 05/18/22 [phone]

## 2022-05-17 ENCOUNTER — Emergency Department: Payer: Medicaid Other

## 2022-05-17 MED ORDER — ONDANSETRON 4 MG PO TBDP: 4.0000 mg | ORAL_TABLET | Freq: Three times a day (TID) | ORAL | 0 refills | Status: DC | PRN

## 2022-05-17 NOTE — Discharge Instructions (Signed)
Call your obstetrics/gynecology doctor today for follow-up appointment. Take Tylenol 650 mg every 6 hours for pain.  Thank you for choosing [place] for your health care today!  Please see your primary doctor this week for a follow up appointment.   If you do not have a primary doctor call the following clinics to establish care:  If you have insurance:  [facility] [phone] [address] [place] [place] [address]   [facility]  [phone] [address] [place] [place] [address]   If you do not have insurance:  [facility]  [phone] [address] [place] [address]  Sometimes, in the early stages of certain disease courses it is difficult to detect in the emergency department evaluation -- so, it is important that you continue to monitor your symptoms and call your doctor right away or return to the emergency department if you develop any new or worsening symptoms.  It was my pleasure to care for you today.   Daneil Dan Modesto Charon, MD

## 2022-05-17 NOTE — ED Provider Notes (Addendum)
I received signout on this patient with abdominal pain in early pregnancy who received an transvaginal ultrasound which showed no signs of ectopic pregnancy, early pregnancy intrauterine, however with some free fluid that will be better evaluated with MRI.  I saw this patient 1230 and she is feeling comfortable at the moment without pain or discomfort at all, resting comfortably with normal hemodynamics, normotensive and normal heart rate.  She denies vaginal bleeding.  Urinalysis likely contaminated, she will get it rechecked at her obstetrics/gyn appointment.  Dispo is pending MRI, if negative for acute emergent pathologies plan for discharge home and is comfortable and stable patient.  Patient remains comfortable with normal hemodynamics.  MRI is completed.  I spoke with Dr.Radparvar regarding the MRI abdomen and pelvis findings, there is for the fluid that looks to be associated with enteritis only with some bowel wall thickening but no other acute abdominal surgical pathology, appendix looks normal.  Given these findings on MRI and patient's stability plan will be for discharge with close obstetrics/gynecology follow-up and return precautions if any worsening.   Pilar Jarvis, MD 05/17/22 Mike Gip    Pilar Jarvis, MD 05/17/22 [phone]

## 2022-06-13 ENCOUNTER — Other Ambulatory Visit: Payer: Self-pay

## 2022-06-13 DIAGNOSIS — Z3A09 9 weeks gestation of pregnancy: Secondary | ICD-10-CM | POA: Diagnosis not present

## 2022-06-13 DIAGNOSIS — O99111 Other diseases of the blood and blood-forming organs and certain disorders involving the immune mechanism complicating pregnancy, first trimester: Secondary | ICD-10-CM | POA: Insufficient documentation

## 2022-06-13 DIAGNOSIS — O209 Hemorrhage in early pregnancy, unspecified: Secondary | ICD-10-CM | POA: Diagnosis not present

## 2022-06-13 DIAGNOSIS — D72829 Elevated white blood cell count, unspecified: Secondary | ICD-10-CM | POA: Diagnosis not present

## 2022-06-13 DIAGNOSIS — O210 Mild hyperemesis gravidarum: Secondary | ICD-10-CM | POA: Insufficient documentation

## 2022-06-13 LAB — URINALYSIS, ROUTINE W REFLEX MICROSCOPIC
Bilirubin Urine: NEGATIVE
Glucose, UA: NEGATIVE mg/dL
Hgb urine dipstick: NEGATIVE
Ketones, ur: 80 mg/dL — AB
Leukocytes,Ua: NEGATIVE
Nitrite: NEGATIVE
Protein, ur: 100 mg/dL — AB
Specific Gravity, Urine: 1.015 (ref 1.005–1.030)
WBC, UA: NONE SEEN WBC/hpf (ref 0–5)
pH: 5 (ref 5.0–8.0)

## 2022-06-13 LAB — COMPREHENSIVE METABOLIC PANEL
ALT: 14 U/L (ref 0–44)
AST: 20 U/L (ref 15–41)
Albumin: 3.7 g/dL (ref 3.5–5.0)
Alkaline Phosphatase: 86 U/L (ref 38–126)
Anion gap: 13 (ref 5–15)
BUN: 14 mg/dL (ref 6–20)
CO2: 16 mmol/L — ABNORMAL LOW (ref 22–32)
Calcium: 9.5 mg/dL (ref 8.9–10.3)
Chloride: 107 mmol/L (ref 98–111)
Creatinine, Ser: 0.67 mg/dL (ref 0.44–1.00)
GFR, Estimated: >60 mL/min (ref >60)
Glucose, Bld: 145 mg/dL — ABNORMAL HIGH (ref 70–99)
Potassium: 3.8 mmol/L (ref 3.5–5.1)
Sodium: 136 mmol/L (ref 135–145)
Total Bilirubin: 0.6 mg/dL (ref 0.3–1.2)
Total Protein: 7.8 g/dL (ref 6.5–8.1)

## 2022-06-13 LAB — CBC
HCT: 51.9 % — ABNORMAL HIGH (ref 36.0–46.0)
Hemoglobin: 17.9 g/dL — ABNORMAL HIGH (ref 12.0–15.0)
MCH: 28.5 pg (ref 26.0–34.0)
MCHC: 34.5 g/dL (ref 30.0–36.0)
MCV: 82.6 fL (ref 80.0–100.0)
Platelets: 366 10*3/uL (ref 150–400)
RBC: 6.28 MIL/uL — ABNORMAL HIGH (ref 3.87–5.11)
RDW: 12.4 % (ref 11.5–15.5)
WBC: 15 10*3/uL — ABNORMAL HIGH (ref 4.0–10.5)
nRBC: 0 % (ref 0.0–0.2)

## 2022-06-13 LAB — LIPASE, BLOOD: Lipase: 34 U/L (ref 11–51)

## 2022-06-13 LAB — HCG, QUANTITATIVE, PREGNANCY: hCG, Beta Chain, Quant, S: 97850 m[IU]/mL — ABNORMAL HIGH (ref <5)

## 2022-06-13 MED ORDER — DEXTROSE 5 % IN LACTATED RINGERS IV BOLUS
2000.0000 mL | Freq: Once | INTRAVENOUS | Status: AC
  Administered 2022-06-13: 2000 mL via INTRAVENOUS

## 2022-06-13 MED ORDER — METOCLOPRAMIDE HCL 5 MG/ML IJ SOLN
10.0000 mg | Freq: Once | INTRAMUSCULAR | Status: AC
  Administered 2022-06-13: 10 mg via INTRAVENOUS
  Filled 2022-06-13: qty 2

## 2022-06-13 MED ORDER — LACTATED RINGERS IV BOLUS
1000.0000 mL | Freq: Once | INTRAVENOUS | Status: AC
  Administered 2022-06-13: 1000 mL via INTRAVENOUS

## 2022-06-13 MED ORDER — DIPHENHYDRAMINE HCL 50 MG/ML IJ SOLN
25.0000 mg | Freq: Once | INTRAMUSCULAR | Status: AC
  Administered 2022-06-13: 25 mg via INTRAVENOUS
  Filled 2022-06-13: qty 1

## 2022-06-13 NOTE — ED Triage Notes (Signed)
Patient reports she is [redacted] weeks pregnant and reports frequent vomiting, generalized weakness as well as intermittent lower abdominal cramping with lower back pain. States she took zofran at 1700 with no relief. States she has not yet had prenatal care. Denies vaginal bleeding.

## 2022-06-14 ENCOUNTER — Emergency Department: Payer: Medicaid Other

## 2022-06-14 ENCOUNTER — Emergency Department
Admission: EM | Admit: 2022-06-14 | Discharge: 2022-06-14 | Disposition: A | Discharge status: Home / Self Care | Payer: Medicaid Other | Attending: Emergency Medicine | Admitting: Emergency Medicine

## 2022-06-14 ENCOUNTER — Other Ambulatory Visit (HOSPITAL_COMMUNITY): Payer: Self-pay

## 2022-06-14 DIAGNOSIS — O468X1 Other antepartum hemorrhage, first trimester: Secondary | ICD-10-CM

## 2022-06-14 DIAGNOSIS — R188 Other ascites: Secondary | ICD-10-CM

## 2022-06-14 DIAGNOSIS — O210 Mild hyperemesis gravidarum: Secondary | ICD-10-CM

## 2022-06-14 NOTE — ED Notes (Signed)
Discharge paperwork given and reviewed by Dr. Katrinka Blazing.

## 2022-06-14 NOTE — ED Provider Notes (Signed)
[facility] Provider Note    Event Date/Time   First MD Initiated Contact with Patient 06/13/22 2331     (approximate)   History   Abdominal Pain, Emesis, and Back Pain   HPI  Wanda Wyatt is a 31 y.o. female who presents to the ED for evaluation of Abdominal Pain, Emesis, and Back Pain   I reviewed the ED visit from about 1 month ago where patient was seen for similar presentation associated for trimester pregnancy.  Ended up getting an MRI abdomen/pelvis to rule out appendicitis which it effectively did, but she was diagnosed with enteritis of possible viral etiology and discharged home.  Patient is now about [redacted] weeks gestation, G4, P2 A1 and returns with recurrent emesis and lower abdominal/pelvic discomfort.  Reports 2 days of inability to keep anything down and recurrent nausea and emesis.  No diarrhea.  Reports lower bilateral pelvic/lower abdominal pain and cramping sensation.  Has had some slight spotting but no novel discharge or heavy bleeding otherwise.  No fevers, syncope, chest pain, shortness of breath.  Physical Exam   Triage Vital Signs: ED Triage Vitals  Enc Vitals Group     BP 06/13/22 2048 119/84     Pulse Rate 06/13/22 2048 (!) 141     Resp 06/13/22 2048 20     Temp 06/13/22 2048 98.1 F (36.7 C)     Temp Source 06/13/22 2048 Oral     SpO2 06/13/22 2048 97 %     Weight 06/13/22 2049 180 lb (81.6 kg)     Height 06/13/22 2049 5\' 4"  (1.626 m)     Head Circumference --      Peak Flow --      Pain Score 06/13/22 2049 7     Pain Loc --      Pain Edu? --      Excl. in GC? --     Most recent vital signs: Vitals:   06/13/22 2244 06/14/22 0251  BP: 110/66 110/63  Pulse: 99 88  Resp: 20 20  Temp: 98 F (36.7 C) 98.3 F (36.8 C)  SpO2: 100% 96%    General: Awake, no distress.  Dry mucous membranes CV:  Good peripheral perfusion.  Resp:  Normal effort.  Abd:  No distention.  Mild lower abdominal and RUQ tenderness without  peritoneal features.  No guarding. MSK:  No deformity noted.  Neuro:  No focal deficits appreciated. Other:     ED Results / Procedures / Treatments   Labs (all labs ordered are listed, but only abnormal results are displayed) Labs Reviewed  COMPREHENSIVE METABOLIC PANEL - Abnormal; Notable for the following components:      Result Value   CO2 16 (*)    Glucose, Bld 145 (*)    All other components within normal limits  CBC - Abnormal; Notable for the following components:   WBC 15.0 (*)    RBC 6.28 (*)    Hemoglobin 17.9 (*)    HCT 51.9 (*)    All other components within normal limits  URINALYSIS, ROUTINE W REFLEX MICROSCOPIC - Abnormal; Notable for the following components:   Color, Urine YELLOW (*)    APPearance CLOUDY (*)    Ketones, ur 80 (*)    Protein, ur 100 (*)    Bacteria, UA RARE (*)    All other components within normal limits  HCG, QUANTITATIVE, PREGNANCY - Abnormal; Notable for the following components:   hCG, Beta Chain, Quant, S 97,850 (*)  All other components within normal limits  LIPASE, BLOOD    EKG Sinus tachycardia rate 137 bpm.  Normal axis and intervals.  No clear signs of acute ischemia.  RADIOLOGY Obstetric ultrasound interpreted by me with IUP, subchorionic hematoma and pelvic free fluid RUQ ultrasound interpreted by me without evidence of gallstones  Official radiology report(s): MR ABDOMEN WO CONTRAST  Result Date: 06/14/2022 CLINICAL DATA:  31 year old pregnant female with lower abdominal and pelvic pain. EXAM: MRI ABDOMEN AND PELVIS WITHOUT CONTRAST TECHNIQUE: Multiplanar multisequence MR imaging of the abdomen and pelvis was performed. No intravenous contrast was administered. COMPARISON:  Abdominal and pelvic MRI 05/17/2022. FINDINGS: COMBINED FINDINGS FOR BOTH MR ABDOMEN AND PELVIS Lower chest: Unremarkable. Hepatobiliary: No definite suspicious cystic or solid hepatic lesions are confidently identified on today's noncontrast  examination. No intra or extrahepatic biliary ductal dilatation. Gallbladder is unremarkable in appearance. Pancreas: No pancreatic mass or peripancreatic fluid collections or inflammatory changes. Spleen:  Unremarkable. Adrenals/Urinary Tract: Bilateral kidneys and adrenal glands are normal in appearance. No hydroureteronephrosis. Urinary bladder is unremarkable in appearance. Stomach/Bowel: Stomach is completely decompressed. No definite pathologic dilatation of small bowel or colon. Normal retrocecal appendix measuring 5 mm in diameter. Vascular/Lymphatic: No definite aneurysm identified in the abdominal or pelvic vasculature on today's noncontrast examination. No lymphadenopathy noted in the abdomen or pelvis. Reproductive: Gravid uterus with single IUP a gestational sac in the left side of the fundal portion of the endometrial canal. Ovaries are unremarkable in appearance. Other:  Small volume of ascites. Musculoskeletal: No aggressive appearing osseous lesions are noted in the visualized portions of the skeleton. IMPRESSION: 1. Normal appendix. 2. Small volume of ascites. 3. Gravid uterus with single IUP. Electronically Signed   By: Trudie Reed M.D.   On: 06/14/2022 05:28   MR PELVIS WO CONTRAST  Result Date: 06/14/2022 CLINICAL DATA:  31 year old pregnant female with lower abdominal and pelvic pain. EXAM: MRI ABDOMEN AND PELVIS WITHOUT CONTRAST TECHNIQUE: Multiplanar multisequence MR imaging of the abdomen and pelvis was performed. No intravenous contrast was administered. COMPARISON:  Abdominal and pelvic MRI 05/17/2022. FINDINGS: COMBINED FINDINGS FOR BOTH MR ABDOMEN AND PELVIS Lower chest: Unremarkable. Hepatobiliary: No definite suspicious cystic or solid hepatic lesions are confidently identified on today's noncontrast examination. No intra or extrahepatic biliary ductal dilatation. Gallbladder is unremarkable in appearance. Pancreas: No pancreatic mass or peripancreatic fluid collections or  inflammatory changes. Spleen:  Unremarkable. Adrenals/Urinary Tract: Bilateral kidneys and adrenal glands are normal in appearance. No hydroureteronephrosis. Urinary bladder is unremarkable in appearance. Stomach/Bowel: Stomach is completely decompressed. No definite pathologic dilatation of small bowel or colon. Normal retrocecal appendix measuring 5 mm in diameter. Vascular/Lymphatic: No definite aneurysm identified in the abdominal or pelvic vasculature on today's noncontrast examination. No lymphadenopathy noted in the abdomen or pelvis. Reproductive: Gravid uterus with single IUP a gestational sac in the left side of the fundal portion of the endometrial canal. Ovaries are unremarkable in appearance. Other:  Small volume of ascites. Musculoskeletal: No aggressive appearing osseous lesions are noted in the visualized portions of the skeleton. IMPRESSION: 1. Normal appendix. 2. Small volume of ascites. 3. Gravid uterus with single IUP. Electronically Signed   By: Trudie Reed M.D.   On: 06/14/2022 05:28   US ABDOMEN LIMITED RUQ (LIVER/GB)  Result Date: 06/14/2022 CLINICAL DATA:  Right upper quadrant pain EXAM: ULTRASOUND ABDOMEN LIMITED RIGHT UPPER QUADRANT COMPARISON:  Ultrasound from earlier in the same day. FINDINGS: Gallbladder: No gallstones or wall thickening visualized. No sonographic Murphy sign noted by  sonographer. Common bile duct: Diameter: 3.3 mm. Liver: No focal lesion identified. Within normal limits in parenchymal echogenicity. Portal vein is patent on color Doppler imaging with normal direction of blood flow towards the liver. Other: Minimal fluid is noted in the right upper quadrant. IMPRESSION: Minimal free fluid within the right upper quadrant. This is stable from the prior exam. Electronically Signed   By: Alcide Clever M.D.   On: 06/14/2022 03:58   US OB LESS THAN 14 WEEKS WITH OB TRANSVAGINAL  Result Date: 06/14/2022 CLINICAL DATA:  Initial evaluation for acute lower abdominal  pain, early pregnancy. EXAM: OBSTETRIC <14 WK [place] AND TRANSVAGINAL OB US TECHNIQUE: Both transabdominal and transvaginal ultrasound examinations were performed for complete evaluation of the gestation as well as the maternal uterus, adnexal regions, and pelvic cul-de-sac. Transvaginal technique was performed to assess early pregnancy. COMPARISON:  Prior ultrasound from 05/16/2022. FINDINGS: Intrauterine gestational sac: Single Yolk sac:  Present Embryo:  Present Cardiac Activity: Present Heart Rate: 162 bpm MSD:   mm    w CRL:  29.8 mm   9 w   6 d                  [place] EDC: 01/11/2023 Subchorionic hemorrhage: Subchorionic hemorrhage measuring 4.1 x 1.7 x 0.7 cm is seen. No associated mass effect. Maternal uterus/adnexae: Right ovary within normal limits measuring 2.6 x 1.3 x 3.0 cm. Left ovary measures 2.5 x 1.8 x 4.2 cm. 2.1 cm dominant left ovarian follicle noted. Moderate to large volume free fluid seen within the pelvis. IMPRESSION: 1. Single viable IUP, estimated gestational age [redacted] weeks and 6 days by crown-rump length, with ultrasound EDC of 01/11/2023. 2. 4 1 x 1.7 x 0.7 cm subchorionic hemorrhage without mass effect. 3. Moderate to large volume nonspecific free fluid within the pelvis. Electronically Signed   By: Rise Mu M.D.   On: 06/14/2022 01:57    PROCEDURES and INTERVENTIONS:  Procedures  Medications  lactated ringers bolus 1,000 mL (0 mLs Intravenous Stopped 06/13/22 2244)  diphenhydrAMINE (BENADRYL) injection 25 mg (25 mg Intravenous Given 06/13/22 2108)  metoCLOPramide (REGLAN) injection 10 mg (10 mg Intravenous Given 06/13/22 2110)  dextrose 5% lactated ringers bolus 2,000 mL (0 mLs Intravenous Stopped 06/14/22 0250)     IMPRESSION / MDM / ASSESSMENT AND PLAN / ED COURSE  I reviewed the triage vital signs and the nursing notes.  Differential diagnosis includes, but is not limited to, miscarriage , appendicitis , cholelithiasis, viral syndrome  {Patient presents with  symptoms of an acute illness or injury that is potentially life-threatening.  31 year old at [redacted] weeks gestation presents with recurrent emesis, dehydration and lower abdominal pain.  Initially appears dry and is tachycardic I suspect hyperemesis gravidarum causing dehydration.  She receives a few liters of D5LR with resolution of her nausea and emesis, subsequently looking much better clinically and tolerating p.o. intake without recurrence of her emesis or nausea.  Urine with ketones, suggestive of dehydration, but no infectious features.  hCG is appropriate elevated, metabolic acidosis in the metabolic panel is expected in the setting of her emesis.  Lipase is normal.  CBC with hemoconcentration and mild leukocytosis, less likely to be of infectious etiology or to represent sepsis.  TVUS obtained due to her spotting and it does demonstrate a moderately sized subchorionic hematoma.  Due to my reassessment and her having persistent RUQ pain, obtain RUQ ultrasound as well, without evidence of hepatobiliary pathology.  On these 2 ultrasounds, she was noted  to have pelvic free fluid of uncertain etiology.  Due to this and some mild residual lower abdominal tenderness, likely from the fluid, I obtained an MRI of her abdomen/pelvis.  This is generally unrevealing without signs of further pathology.  Small volume ascites is noted.  Not sure if this is reactive from her enteritis that she was experiencing last month. Ultimately with her resolving symptoms, tolerating p.o. intake and resolution of tenderness on exam on my final reevaluation I think she would be suitable for outpatient management.  We discussed close return precautions and OB follow-up.   Clinical Course as of 06/14/22 0657  Mon Jun 14, 2022  0150 Reassessed.  She reports feeling much better she certainly looks better.  I reexamined her abdomen and it is certainly improved, but she still has persistent 2 discrete RUQ tenderness.  Discussed the  possibility of gallstones or other hepatobiliary pathology contributing to her symptoms and she is agreeable for another ultrasound. [DS]  0430 Reassessed and updated patient and her husband.  We thoroughly discussed her workup so far.  We discussed hyperemesis, dehydration.  We discussed IUP and subchorionic hemorrhage.  We discussed RUQ ultrasound and pelvic ultrasound with signs of nonspecific fluid in the abdomen.  She acknowledges feeling better, but is still "tender."  I reexamined her and she is still tender to the lower abdomen.  I recommended an MRI and she is agreeable. [DS]    Clinical Course User Index [DS] Delton PrairieSmith, Tandy Grawe, MD     FINAL CLINICAL IMPRESSION(S) / ED DIAGNOSES   Final diagnoses:  Subchorionic hematoma in first trimester, single or unspecified fetus  Free fluid in pelvis  Hyperemesis gravidarum     Rx / DC Orders   ED Discharge Orders     None        Note:  This document was prepared using Dragon voice recognition software and may include unintentional dictation errors.   Delton PrairieSmith, Kema Santaella, MD 06/14/22 [phone]

## 2022-07-05 NOTE — L&D Delivery Note (Signed)
Delivery Note   Wanda Wyatt is a 32 y.o. [id number] at 109w3d Estimated Date of Delivery: 01/15/23  PRE-OPERATIVE DIAGNOSIS:  1) [redacted]w[redacted]d pregnancy.  2) Premature rupture of membranes  POST-OPERATIVE DIAGNOSIS:  1) [redacted]w[redacted]d pregnancy s/p Vaginal, Spontaneous    Delivery Type: Vaginal, Spontaneous    Delivery Anesthesia: Epidural   Labor Complications:  ROM>18h    ESTIMATED BLOOD LOSS: 50  ml    FINDINGS:   1) female infant, Apgar scores of 8   at 1 minute and 9   at 5 minutes and a birthweight of 122.05  ounces.     SPECIMENS:   PLACENTA:   Appearance: Intact    Removal: Spontaneous      Disposition:    CORD BLOOD: Not Indicated  DISPOSITION:  Infant left in stable condition in the delivery room, with L&D personnel and mother,  NARRATIVE SUMMARY: Labor course:  Ludy Hefty is a [id number] at [redacted]w[redacted]d who presented to Labor & Delivery for induction of labor due to PROM. She was assessed in triage and found to be ruptured for clear fluid at 2230 01/10/23. After RNST and counseling she discharged home. She returned to L&D at 12h post SROM without active labor. Her initial cervical exam was 3/50/-3. Labor proceeded with pitocin augmentation. She received an epidural for pain management with relief. She was found to be completely dilated at 1931. With excellent maternal pushing effort, she birthed a viable female infant at 110. There was not a nuchal cord. The shoulders were birthed without difficulty. The infant was placed skin-to-skin with mother. The cord was doubly clamped and cut when pulsations ceased. The placenta delivered spontaneously and was noted to be intact with a 3VC. A perineal and vaginal examination was performed. Episiotomy/Lacerations: 1st degree , hemostatic & therefore not repaired  The patient tolerated this well. Mother and baby were left in stable condition.   Dominica Severin, CNM 01/12/2023 6:50 AM

## 2022-08-16 ENCOUNTER — Telehealth: Payer: Self-pay | Admitting: Advanced Practice Midwife

## 2022-08-16 ENCOUNTER — Ambulatory Visit (INDEPENDENT_AMBULATORY_CARE_PROVIDER_SITE_OTHER): Payer: Self-pay

## 2022-08-16 ENCOUNTER — Other Ambulatory Visit: Payer: Self-pay | Admitting: Advanced Practice Midwife

## 2022-08-16 VITALS — Wt 193.0 lb

## 2022-08-16 DIAGNOSIS — Z3689 Encounter for other specified antenatal screening: Secondary | ICD-10-CM

## 2022-08-16 DIAGNOSIS — Z3480 Encounter for supervision of other normal pregnancy, unspecified trimester: Secondary | ICD-10-CM

## 2022-08-16 DIAGNOSIS — Z369 Encounter for antenatal screening, unspecified: Secondary | ICD-10-CM

## 2022-08-16 NOTE — Progress Notes (Signed)
Anatomy scan ordered

## 2022-08-16 NOTE — Patient Instructions (Signed)
Second Trimester of Pregnancy  The second trimester of pregnancy is from week 13 through week 27. This is months 4 through 6 of pregnancy. The second trimester is often a time when you feel your best. Your body has adjusted to being pregnant, and you begin to feel better physically. During the second trimester: Morning sickness has lessened or stopped completely. You may have more energy. You may have an increase in appetite. The second trimester is also a time when the unborn baby (fetus) is growing rapidly. At the end of the sixth month, the fetus may be up to 12 inches long and weigh about 1 pounds. You will likely begin to feel the baby move (quickening) between 16 and 20 weeks of pregnancy. Body changes during your second trimester Your body continues to go through many changes during your second trimester. The changes vary and generally return to normal after the baby is born. Physical changes Your weight will continue to increase. You will notice your lower abdomen bulging out. You may begin to get stretch marks on your hips, abdomen, and breasts. Your breasts will continue to grow and to become tender. Dark spots or blotches (chloasma or mask of pregnancy) may develop on your face. A dark line from your belly button to the pubic area (linea nigra) may appear. You may have changes in your hair. These can include thickening of your hair, rapid growth, and changes in texture. Some people also have hair loss during or after pregnancy, or hair that feels dry or thin. Health changes You may develop headaches. You may have heartburn. You may develop constipation. You may develop hemorrhoids or swollen, bulging veins (varicose veins). Your gums may bleed and may be sensitive to brushing and flossing. You may urinate more often because the fetus is pressing on your bladder. You may have back pain. This is caused by: Weight gain. Pregnancy hormones that are relaxing the joints in your  pelvis. A shift in weight and the muscles that support your balance. Follow these instructions at home: Medicines Follow your health care provider's instructions regarding medicine use. Specific medicines may be either safe or unsafe to take during pregnancy. Do not take any medicines unless approved by your health care provider. Take a prenatal vitamin that contains at least 600 micrograms (mcg) of folic acid. Eating and drinking Eat a healthy diet that includes fresh fruits and vegetables, whole grains, good sources of protein such as meat, eggs, or tofu, and low-fat dairy products. Avoid raw meat and unpasteurized juice, milk, and cheese. These carry germs that can harm you and your baby. You may need to take these actions to prevent or treat constipation: Drink enough fluid to keep your urine pale yellow. Eat foods that are high in fiber, such as beans, whole grains, and fresh fruits and vegetables. Limit foods that are high in fat and processed sugars, such as fried or sweet foods. Activity Exercise only as directed by your health care provider. Most people can continue their usual exercise routine during pregnancy. Try to exercise for 30 minutes at least 5 days a week. Stop exercising if you develop contractions in your uterus. Stop exercising if you develop pain or cramping in the lower abdomen or lower back. Avoid exercising if it is very hot or humid or if you are at a high altitude. Avoid heavy lifting. If you choose to, you may have sex unless your health care provider tells you not to. Relieving pain and discomfort Wear a supportive   bra to prevent discomfort from breast tenderness. Take warm sitz baths to soothe any pain or discomfort caused by hemorrhoids. Use hemorrhoid cream if your health care provider approves. Rest with your legs raised (elevated) if you have leg cramps or low back pain. If you develop varicose veins: Wear support hose as told by your health care  provider. Elevate your feet for 15 minutes, 3-4 times a day. Limit salt in your diet. Safety Wear your seat belt at all times when driving or riding in a car. Talk with your health care provider if someone is verbally or physically abusive to you. Lifestyle Do not use hot tubs, steam rooms, or saunas. Do not douche. Do not use tampons or scented sanitary pads. Avoid cat litter boxes and soil used by cats. These carry germs that can cause birth defects in the baby and possibly loss of the fetus by miscarriage or stillbirth. Do not use herbal remedies, alcohol, illegal drugs, or medicines that are not approved by your health care provider. Chemicals in these products can harm your baby. Do not use any products that contain nicotine or tobacco, such as cigarettes, e-cigarettes, and chewing tobacco. If you need help quitting, ask your health care provider. General instructions During a routine prenatal visit, your health care provider will do a physical exam and other tests. He or she will also discuss your overall health. Keep all follow-up visits. This is important. Ask your health care provider for a referral to a local prenatal education class. Ask for help if you have counseling or nutritional needs during pregnancy. Your health care provider can offer advice or refer you to specialists for help with various needs. Where to find more information American Pregnancy Association: americanpregnancy.org American College of Obstetricians and Gynecologists: acog.org/en/Womens%20Health/Pregnancy Office on Women's Health: womenshealth.gov/pregnancy Contact a health care provider if you have: A headache that does not go away when you take medicine. Vision changes or you see spots in front of your eyes. Mild pelvic cramps, pelvic pressure, or nagging pain in the abdominal area. Persistent nausea, vomiting, or diarrhea. A bad-smelling vaginal discharge or foul-smelling urine. Pain when you  urinate. Sudden or extreme swelling of your face, hands, ankles, feet, or legs. A fever. Get help right away if you: Have fluid leaking from your vagina. Have spotting or bleeding from your vagina. Have severe abdominal cramping or pain. Have difficulty breathing. Have chest pain. Have fainting spells. Have not felt your baby move for the time period told by your health care provider. Have new or increased pain, swelling, or redness in an arm or leg. Summary The second trimester of pregnancy is from week 13 through week 27 (months 4 through 6). Do not use herbal remedies, alcohol, illegal drugs, or medicines that are not approved by your health care provider. Chemicals in these products can harm your baby. Exercise only as directed by your health care provider. Most people can continue their usual exercise routine during pregnancy. Keep all follow-up visits. This is important. This information is not intended to replace advice given to you by your health care provider. Make sure you discuss any questions you have with your health care provider. Document Revised: 11/28/2019 Document Reviewed: 10/04/2019 Elsevier Patient Education  2023 [employer]. Commonly Asked Questions During Pregnancy  Cats: A parasite can be excreted in cat feces.  To avoid exposure you need to have another person empty the little box.  If you must empty the litter box you will need to wear gloves.    Wash your hands after handling your cat.  This parasite can also be found in raw or undercooked meat so this should also be avoided.  Colds, Sore Throats, Flu: Please check your medication sheet to see what you can take for symptoms.  If your symptoms are unrelieved by these medications please call the office.  Dental Work: Most any dental work your dentist recommends is permitted.  X-rays should only be taken during the first trimester if absolutely necessary.  Your abdomen should be shielded with a lead apron during all  x-rays.  Please notify your provider prior to receiving any x-rays.  Novocaine is fine; gas is not recommended.  If your dentist requires a note from us prior to dental work please call the office and we will provide one for you.  Exercise: Exercise is an important part of staying healthy during your pregnancy.  You may continue most exercises you were accustomed to prior to pregnancy.  Later in your pregnancy you will most likely notice you have difficulty with activities requiring balance like riding a bicycle.  It is important that you listen to your body and avoid activities that put you at a higher risk of falling.  Adequate rest and staying well hydrated are a must!  If you have questions about the safety of specific activities ask your provider.    Exposure to Children with illness: Try to avoid obvious exposure; report any symptoms to us when noted,  If you have chicken pos, red measles or mumps, you should be immune to these diseases.   Please do not take any vaccines while pregnant unless you have checked with your OB provider.  Fetal Movement: After 28 weeks we recommend you do "kick counts" twice daily.  Lie or sit down in a calm quiet environment and count your baby movements "kicks".  You should feel your baby at least 10 times per hour.  If you have not felt 10 kicks within the first hour get up, walk around and have something sweet to eat or drink then repeat for an additional hour.  If count remains less than 10 per hour notify your provider.  Fumigating: Follow your pest control agent's advice as to how long to stay out of your home.  Ventilate the area well before re-entering.  Hemorrhoids:   Most over-the-counter preparations can be used during pregnancy.  Check your medication to see what is safe to use.  It is important to use a stool softener or fiber in your diet and to drink lots of liquids.  If hemorrhoids seem to be getting worse please call the office.   Hot Tubs:  Hot tubs  Jacuzzis and saunas are not recommended while pregnant.  These increase your internal body temperature and should be avoided.  Intercourse:  Sexual intercourse is safe during pregnancy as long as you are comfortable, unless otherwise advised by your provider.  Spotting may occur after intercourse; report any bright red bleeding that is heavier than spotting.  Labor:  If you know that you are in labor, please go to the hospital.  If you are unsure, please call the office and let us help you decide what to do.  Lifting, straining, etc:  If your job requires heavy lifting or straining please check with your provider for any limitations.  Generally, you should not lift items heavier than that you can lift simply with your hands and arms (no back muscles)  Painting:  Paint fumes do not harm your pregnancy,   but may make you ill and should be avoided if possible.  Latex or water based paints have less odor than oils.  Use adequate ventilation while painting.  Permanents & Hair Color:  Chemicals in hair dyes are not recommended as they cause increase hair dryness which can increase hair loss during pregnancy.  " Highlighting" and permanents are allowed.  Dye may be absorbed differently and permanents may not hold as well during pregnancy.  Sunbathing:  Use a sunscreen, as skin burns easily during pregnancy.  Drink plenty of fluids; avoid over heating.  Tanning Beds:  Because their possible side effects are still unknown, tanning beds are not recommended.  Ultrasound Scans:  Routine ultrasounds are performed at approximately 20 weeks.  You will be able to see your baby's general anatomy an if you would like to know the gender this can usually be determined as well.  If it is questionable when you conceived you may also receive an ultrasound early in your pregnancy for dating purposes.  Otherwise ultrasound exams are not routinely performed unless there is a medical necessity.  Although you can request a scan  we ask that you pay for it when conducted because insurance does not cover " patient request" scans.  Work: If your pregnancy proceeds without complications you may work until your due date, unless your physician or employer advises otherwise.  Round Ligament Pain/Pelvic Discomfort:  Sharp, shooting pains not associated with bleeding are fairly common, usually occurring in the second trimester of pregnancy.  They tend to be worse when standing up or when you remain standing for long periods of time.  These are the result of pressure of certain pelvic ligaments called "round ligaments".  Rest, Tylenol and heat seem to be the most effective relief.  As the womb and fetus grow, they rise out of the pelvis and the discomfort improves.  Please notify the office if your pain seems different than that described.  It may represent a more serious condition.  Common Medications Safe in Pregnancy  Acne:      Constipation:  Benzoyl Peroxide     Colace  Clindamycin      Dulcolax Suppository  Topica Erythromycin     Fibercon  Salicylic Acid      Metamucil         Miralax AVOID:        Senakot   Accutane    Cough:  Retin-A       Cough Drops  Tetracycline      Phenergan w/ Codeine if Rx  Minocycline      Robitussin (Plain & DM)  Antibiotics:     Crabs/Lice:  Ceclor       RID  Cephalosporins    AVOID:  E-Mycins      Kwell  Keflex  Macrobid/Macrodantin   Diarrhea:  Penicillin      Kao-Pectate  Zithromax      Imodium AD         PUSH FLUIDS AVOID:       Cipro     Fever:  Tetracycline      Tylenol (Regular or Extra  Minocycline       Strength)  Levaquin      Extra Strength-Do not          Exceed 8 tabs/24 hrs Caffeine:        <200mg/day (equiv. To 1 cup of coffee or  approx. 3 12 oz sodas)         Gas: Cold/Hayfever:         Gas-X  Benadryl      Mylicon  Claritin       Phazyme  **Claritin-D        Chlor-Trimeton    Headaches:  Dimetapp      ASA-Free Excedrin  Drixoral-Non-Drowsy     Cold  Compress  Mucinex (Guaifenasin)     Tylenol (Regular or Extra  Sudafed/Sudafed-12 Hour     Strength)  **Sudafed PE Pseudoephedrine   Tylenol Cold & Sinus     Vicks Vapor Rub  Zyrtec  **AVOID if Problems With Blood Pressure         Heartburn: Avoid lying down for at least 1 hour after meals  Aciphex      Maalox     Rash:  Milk of Magnesia     Benadryl    Mylanta       1% Hydrocortisone Cream  Pepcid  Pepcid Complete   Sleep Aids:  Prevacid      Ambien   Prilosec       Benadryl  Rolaids       Chamomile Tea  Tums (Limit 4/day)     Unisom         Tylenol PM         Warm milk-add vanilla or  Hemorrhoids:       Sugar for taste  Anusol/Anusol H.C.  (RX: Analapram 2.5%)  Sugar Substitutes:  Hydrocortisone OTC     Ok in moderation  Preparation H      Tucks        Vaseline lotion applied to tissue with wiping    Herpes:     Throat:  Acyclovir      Oragel  Famvir  Valtrex     Vaccines:         Flu Shot Leg Cramps:       *Gardasil  Benadryl      Hepatitis A         Hepatitis B Nasal Spray:       Pneumovax  Saline Nasal Spray     Polio Booster         Tetanus Nausea:       Tuberculosis test or PPD  Vitamin B6 25 mg TID   AVOID:    Dramamine      *Gardasil  Emetrol       Live Poliovirus  Ginger Root 250 mg QID    MMR (measles, mumps &  High Complex Carbs @ Bedtime    rebella)  Sea Bands-Accupressure    Varicella (Chickenpox)  Unisom 1/2 tab TID     *No known complications           If received before Pain:         Known pregnancy;   Darvocet       Resume series after  Lortab        Delivery  Percocet    Yeast:   Tramadol      Femstat  Tylenol 3      Gyne-lotrimin  Ultram       Monistat  Vicodin           MISC:         All Sunscreens           Hair Coloring/highlights          Insect Repellant's          (Including DEET)         Mystic Tans  

## 2022-08-16 NOTE — Progress Notes (Addendum)
New OB Intake  I connected with  Wanda Wyatt on 08/16/22 at 10:15 AM EST by telephone and verified that I am speaking with the correct person using two identifiers. Nurse is located at [employer] and pt is located at home.  I explained I am completing New OB Intake today. We discussed her EDD of 01/15/2023 that is based on LMP of 04/10/2022. Pt is G4/P2012. I reviewed her allergies, medications, Medical/Surgical/OB history, and appropriate screenings. There are cats in the home no.  Based on history, this is a/an pregnancy uncomplicated .   Patient Active Problem List   Diagnosis Date Noted   Abdominal pain in pregnancy, antepartum 11/21/2015    Concerns addressed today None; pt is late to care d/t M'caid just went through.  Delivery Plans:  Plans to deliver at  [facility].  Anatomy US Explained first scheduled [place] will be around 20 weeks. Anatomy US will be scheduled soon.  Pt aware she will be contacted to schedule this.  Labs Discussed genetic screening with patient. Patient declines genetic testing.  Discussed possible labs to be drawn at new OB appointment.  COVID Vaccine Patient has not had COVID vaccine.   Social Determinants of Health Food Insecurity: denies food insecurity Transportation: Patient denies transportation needs. Childcare: Discussed no children allowed at ultrasound appointments.   First visit review I reviewed new OB appt with pt. I explained she will have ob bloodwork and pap smear/pelvic exam if indicated. Explained pt will be seen by Rod Can, CNM at first visit; encounter routed to appropriate provider.   Cleophas Dunker, [facility] 08/16/2022  10:24 AM

## 2022-08-16 NOTE — Telephone Encounter (Signed)
Reached out to patient to schedule her anatomy US. No answer, VM is full. Sent mychart message as an alternate attempt to reach patient for scheduling.

## 2022-08-20 ENCOUNTER — Other Ambulatory Visit: Payer: Self-pay

## 2022-08-20 ENCOUNTER — Other Ambulatory Visit (HOSPITAL_COMMUNITY)
Admission: RE | Admit: 2022-08-20 | Discharge: 2022-08-20 | Disposition: A | Discharge status: Home / Self Care | Payer: Medicaid Other | Source: Ambulatory Visit | Attending: Advanced Practice Midwife | Admitting: Advanced Practice Midwife

## 2022-08-20 DIAGNOSIS — Z369 Encounter for antenatal screening, unspecified: Secondary | ICD-10-CM | POA: Insufficient documentation

## 2022-08-20 DIAGNOSIS — Z3480 Encounter for supervision of other normal pregnancy, unspecified trimester: Secondary | ICD-10-CM | POA: Insufficient documentation

## 2022-08-20 DIAGNOSIS — Z1379 Encounter for other screening for genetic and chromosomal anomalies: Secondary | ICD-10-CM

## 2022-08-20 DIAGNOSIS — Z124 Encounter for screening for malignant neoplasm of cervix: Secondary | ICD-10-CM | POA: Diagnosis not present

## 2022-08-20 DIAGNOSIS — Z3A19 19 weeks gestation of pregnancy: Secondary | ICD-10-CM | POA: Diagnosis not present

## 2022-08-20 DIAGNOSIS — Z113 Encounter for screening for infections with a predominantly sexual mode of transmission: Secondary | ICD-10-CM | POA: Diagnosis not present

## 2022-08-21 LAB — URINALYSIS, ROUTINE W REFLEX MICROSCOPIC
Bilirubin, UA: NEGATIVE
Glucose, UA: NEGATIVE
Leukocytes,UA: NEGATIVE
Nitrite, UA: NEGATIVE
RBC, UA: NEGATIVE
Specific Gravity, UA: 1.022 (ref 1.005–1.030)
Urobilinogen, Ur: 0.2 mg/dL (ref 0.2–1.0)
pH, UA: 7.5 (ref 5.0–7.5)

## 2022-08-21 LAB — CBC/D/PLT+RPR+RH+ABO+RUBIGG...
Antibody Screen: NEGATIVE
Basophils Absolute: 0 10*3/uL (ref 0.0–0.2)
Basos: 0 %
EOS (ABSOLUTE): 0.1 10*3/uL (ref 0.0–0.4)
Eos: 1 %
HCV Ab: NONREACTIVE
HIV Screen 4th Generation wRfx: NONREACTIVE
Hematocrit: 38.2 % (ref 34.0–46.6)
Hemoglobin: 13 g/dL (ref 11.1–15.9)
Hepatitis B Surface Ag: NEGATIVE
Immature Grans (Abs): 0 10*3/uL (ref 0.0–0.1)
Immature Granulocytes: 0 %
Lymphocytes Absolute: 1.4 10*3/uL (ref 0.7–3.1)
Lymphs: 19 %
MCH: 29.6 pg (ref 26.6–33.0)
MCHC: 34 g/dL (ref 31.5–35.7)
MCV: 87 fL (ref 79–97)
Monocytes Absolute: 0.2 10*3/uL (ref 0.1–0.9)
Monocytes: 3 %
Neutrophils Absolute: 5.6 10*3/uL (ref 1.4–7.0)
Neutrophils: 77 %
Platelets: 277 10*3/uL (ref 150–450)
RBC: 4.39 x10E6/uL (ref 3.77–5.28)
RDW: 13.4 % (ref 11.7–15.4)
RPR Ser Ql: NONREACTIVE
Rh Factor: POSITIVE
Rubella Antibodies, IGG: 5.69 index (ref >0.99)
Varicella zoster IgG: 393 index (ref >165)
WBC: 7.4 10*3/uL (ref 3.4–10.8)

## 2022-08-21 LAB — HCV INTERPRETATION

## 2022-08-22 LAB — URINE CULTURE, OB REFLEX

## 2022-08-22 LAB — CULTURE, OB URINE

## 2022-08-23 LAB — URINE CYTOLOGY ANCILLARY ONLY
Chlamydia: NEGATIVE
Comment: NEGATIVE
Comment: NORMAL
Neisseria Gonorrhea: NEGATIVE

## 2022-08-23 LAB — MONITOR DRUG PROFILE 14(MW)
Amphetamine Scrn, Ur: NEGATIVE ng/mL
BARBITURATE SCREEN URINE: NEGATIVE ng/mL
BENZODIAZEPINE SCREEN, URINE: NEGATIVE ng/mL
Buprenorphine, Urine: NEGATIVE ng/mL
CANNABINOIDS UR QL SCN: NEGATIVE ng/mL
Cocaine (Metab) Scrn, Ur: NEGATIVE ng/mL
Creatinine(Crt), U: 141.7 mg/dL (ref 20.0–300.0)
Fentanyl, Urine: NEGATIVE pg/mL
Meperidine Screen, Urine: NEGATIVE ng/mL
Methadone Screen, Urine: NEGATIVE ng/mL
OXYCODONE+OXYMORPHONE UR QL SCN: NEGATIVE ng/mL
Opiate Scrn, Ur: NEGATIVE ng/mL
Ph of Urine: 8.1 (ref 4.5–8.9)
Phencyclidine Qn, Ur: NEGATIVE ng/mL
Propoxyphene Scrn, Ur: NEGATIVE ng/mL
SPECIFIC GRAVITY: 1.023
Tramadol Screen, Urine: NEGATIVE ng/mL

## 2022-08-23 LAB — NICOTINE SCREEN, URINE: Cotinine Ql Scrn, Ur: NEGATIVE ng/mL

## 2022-08-24 ENCOUNTER — Ambulatory Visit (INDEPENDENT_AMBULATORY_CARE_PROVIDER_SITE_OTHER): Payer: Self-pay | Admitting: Advanced Practice Midwife

## 2022-08-24 ENCOUNTER — Encounter: Payer: Self-pay | Admitting: Advanced Practice Midwife

## 2022-08-24 ENCOUNTER — Other Ambulatory Visit (HOSPITAL_COMMUNITY)
Admission: RE | Admit: 2022-08-24 | Discharge: 2022-08-24 | Disposition: A | Discharge status: Home / Self Care | Payer: Medicaid Other | Source: Ambulatory Visit | Attending: Advanced Practice Midwife | Admitting: Advanced Practice Midwife

## 2022-08-24 VITALS — BP 111/73 | HR 96 | Wt 199.9 lb

## 2022-08-24 DIAGNOSIS — Z113 Encounter for screening for infections with a predominantly sexual mode of transmission: Secondary | ICD-10-CM | POA: Diagnosis not present

## 2022-08-24 DIAGNOSIS — Z3482 Encounter for supervision of other normal pregnancy, second trimester: Secondary | ICD-10-CM | POA: Diagnosis present

## 2022-08-24 DIAGNOSIS — Z124 Encounter for screening for malignant neoplasm of cervix: Secondary | ICD-10-CM | POA: Diagnosis not present

## 2022-08-24 DIAGNOSIS — Z369 Encounter for antenatal screening, unspecified: Secondary | ICD-10-CM | POA: Insufficient documentation

## 2022-08-24 DIAGNOSIS — Z3A19 19 weeks gestation of pregnancy: Secondary | ICD-10-CM | POA: Insufficient documentation

## 2022-08-24 LAB — POCT URINALYSIS DIPSTICK OB
Bilirubin, UA: NEGATIVE
Blood, UA: NEGATIVE
Glucose, UA: NEGATIVE
Ketones, UA: NEGATIVE
Leukocytes, UA: NEGATIVE
Nitrite, UA: NEGATIVE
POC,PROTEIN,UA: NEGATIVE
Spec Grav, UA: 1.015 (ref 1.010–1.025)
Urobilinogen, UA: 0.2 E.U./dL
pH, UA: 7.5 (ref 5.0–8.0)

## 2022-08-24 NOTE — Patient Instructions (Signed)
Prenatal Care Prenatal care is health care during pregnancy. It helps you and your unborn baby (fetus) stay as healthy as possible. Prenatal care may be provided by a midwife, a family practice doctor, a IT consultant (nurse practitioner or physician assistant), or a childbirth and pregnancy doctor (obstetrician). How does this affect me? During pregnancy, you will be closely monitored for any new conditions that might develop. To lower your risk of pregnancy complications, you and your health care provider will talk about any underlying conditions you have. How does this affect my baby? Early and consistent prenatal care increases the chance that your baby will be healthy during pregnancy. Prenatal care lowers the risk that your baby will be: Born early (prematurely). Smaller than expected at birth (small for gestational age). What can I expect at the first prenatal care visit? Your first prenatal care visit will likely be the longest. You should schedule your first prenatal care visit as soon as you know that you are pregnant. Your first visit is a good time to talk about any questions or concerns you have about pregnancy. Medical history At your visit, you and your health care provider will talk about your medical history, including: Any past pregnancies. Your family's medical history. Medical history of the baby's father. Any long-term (chronic) health conditions you have and how you manage them. Any surgeries or procedures you have had. Any current over-the-counter or prescription medicines, herbs, or supplements that you are taking. Other factors that could pose a risk to your baby, including: Exposure to harmful chemicals or radiation at work or at home. Any substance use, including tobacco, alcohol, and drug use. Your home setting and your stress levels, including: Exposure to abuse or violence. Household financial strain. Your daily health habits, including diet and  exercise. Tests and screenings Your health care provider will: Measure your weight, height, and blood pressure. Do a physical exam, including a pelvic and breast exam. Perform blood tests and urine tests to check for: Urinary tract infection. Sexually transmitted infections (STIs). Low iron levels in your blood (anemia). Blood type and certain proteins on red blood cells (Rh antibodies). Infections and immunity to viruses, such as hepatitis B and rubella. HIV (human immunodeficiency virus). Discuss your options for genetic screening. Tips about staying healthy Your health care provider will also give you information about how to keep yourself and your baby healthy, including: Nutrition and taking vitamins. Physical activity. How to manage pregnancy symptoms such as nausea and vomiting (morning sickness). Infections and substances that may be harmful to your baby and how to avoid them. Food safety. Dental care. Working. Travel. Warning signs to watch for and when to call your health care provider. How often will I have prenatal care visits? After your first prenatal care visit, you will have regular visits throughout your pregnancy. The visit schedule is often as follows: Up to week 28 of pregnancy: once every 4 weeks. 28-36 weeks: once every 2 weeks. After 36 weeks: every week until delivery. Some women may have visits more or less often depending on any underlying health conditions and the health of the baby. Keep all follow-up and prenatal care visits. This is important. What happens during routine prenatal care visits? Your health care provider will: Measure your weight and blood pressure. Check for fetal heart sounds. Measure the height of your uterus in your abdomen (fundal height). This may be measured starting around week 20 of pregnancy. Check the position of your baby inside your uterus. Ask questions  about your diet, sleeping patterns, and whether you can feel the baby  move. Review warning signs to watch for and signs of labor. Ask about any pregnancy symptoms you are having and how you are dealing with them. Symptoms may include: Headaches. Nausea and vomiting. Vaginal discharge. Swelling. Fatigue. Constipation. Changes in your vision. Feeling persistently sad or anxious. Any discomfort, including back or pelvic pain. Bleeding or spotting. Make a list of questions to ask your health care provider at your routine visits. What tests might I have during prenatal care visits? You may have blood, urine, and imaging tests throughout your pregnancy, such as: Urine tests to check for glucose, protein, or signs of infection. Glucose tests to check for a form of diabetes that can develop during pregnancy (gestational diabetes mellitus). This is usually done around week 24 of pregnancy. Ultrasounds to check your baby's growth and development, to check for birth defects, and to check your baby's well-being. These can also help to decide when you should deliver your baby. A test to check for group B strep (GBS) infection. This is usually done around week 36 of pregnancy. Genetic testing. This may include blood, fluid, or tissue sampling, or imaging tests, such as an ultrasound. Some genetic tests are done during the first trimester and some are done during the second trimester. What else can I expect during prenatal care visits? Your health care provider may recommend getting certain vaccines during pregnancy. These may include: A yearly flu shot (annual influenza vaccine). This is especially important if you will be pregnant during flu season. Tdap (tetanus, diphtheria, pertussis) vaccine. Getting this vaccine during pregnancy can protect your baby from whooping cough (pertussis) after birth. This vaccine may be recommended between weeks 27 and 36 of pregnancy. A COVID-19 vaccine. Later in your pregnancy, your health care provider may give you information  about: Childbirth and breastfeeding classes. Choosing a health care provider for your baby. Umbilical cord banking. Breastfeeding. Birth control after your baby is born. The hospital labor and delivery unit and how to set up a tour. Registering at the hospital before you go into labor. Where to find more information Office on Women's Health: LegalWarrants.gl American Pregnancy Association: americanpregnancy.org March of Dimes: marchofdimes.org Summary Prenatal care helps you and your baby stay as healthy as possible during pregnancy. Your first prenatal care visit will most likely be the longest. You will have visits and tests throughout your pregnancy to monitor your health and your baby's health. Bring a list of questions to your visits to ask your health care provider. Make sure to keep all follow-up and prenatal care visits. This information is not intended to replace advice given to you by your health care provider. Make sure you discuss any questions you have with your health care provider. Document Revised: 04/03/2020 Document Reviewed: 04/03/2020 Elsevier Patient Education  [place]. Exercise During Pregnancy Exercise is an important part of being healthy for people of all ages. Exercise improves the function of your heart and lungs and helps you maintain strength, flexibility, and a healthy body weight. Exercise also boosts energy levels and elevates mood. Most women should exercise regularly during pregnancy. Exercise routines may need to change as your pregnancy progresses. In rare cases, women with certain medical conditions or complications may be asked to limit or avoid exercise during pregnancy. Your health care provider will give you information on what will work for you. How does this affect me? Along with maintaining general strength and flexibility, exercising during  pregnancy can help: Keep strength in muscles that are used during labor and  childbirth. Decrease low back pain or symptoms of depression. Control weight gain during pregnancy. Reduce the risk of needing insulin if you develop diabetes during pregnancy. Decrease the risk of cesarean delivery. Speed up your recovery after giving birth. Relieve constipation. How does this affect my baby? Exercise can help you have a healthy pregnancy. Exercise does not cause early (premature) birth. It will not cause your baby to weigh less at birth. What exercises can I do? Many exercises are safe for you to do during pregnancy. Do a variety of exercises that safely increase your heart and breathing rates and help you build and maintain muscle strength. Do exercises exactly as told by your health care provider. You may do these exercises: Walking. Swimming. Water aerobics. Riding a stationary bike. Modified yoga or Pilates. Tell your instructor that you are pregnant. Avoid overstretching, and avoid lying on your back for long periods of time. Running or jogging. Choose this type of exercise only if: You ran or jogged regularly before your pregnancy. You can run or jog and still talk in complete sentences. What exercises should I avoid? You may be told to limit high-intensity exercise depending on your level of fitness and whether you exercised regularly before you were pregnant. You can tell that you are exercising at a high intensity if you are breathing much harder and faster and cannot hold a conversation while exercising. You must avoid: Contact sports. Activities that put you at risk for falling on or being hit in the belly, such as downhill skiing, waterskiing, surfing, rock climbing, cycling, gymnastics, and horseback riding. Scuba diving. Skydiving. Hot yoga or hot Pilates. These activities take place in a room that is heated to high temperatures. Jogging or running, unless you jogged or ran regularly before you were pregnant. While jogging or running, you should always be  able to talk in full sentences. Do not run or jog so fast that you are unable to have a conversation. Do not exercise at more than 6,000 feet above sea level (high elevation) if you are not used to exercising at high elevation. How do I exercise in a safe way?  Avoid overheating. Do not exercise in very high temperatures. Wear loose-fitting, breathable clothes. Avoid dehydration. Drink enough fluid before, during, and after exercise to keep your urine pale yellow. Avoid overstretching. Because of hormone changes during pregnancy, it is easy to overstretch muscles, tendons, and ligaments. Start slowly and ask your health care provider to recommend the types of exercise that are safe for you. Do not exercise to lose weight. Wear a sports bra to support your breasts. Avoid standing still or lying flat on your back as much as you can. Follow these instructions at home: Exercise on most days or all days of the week. Try to exercise for 30 minutes a day, 5 days a week, unless your health care provider tells you not to. If you actively exercised before your pregnancy and you are healthy, your health care provider may tell you to continue to do moderate-intensity to high-intensity exercise. If you are just starting to exercise or did not exercise much before your pregnancy, your health care provider may tell you to do low-intensity to moderate-intensity exercise. Questions to ask your health care provider Is exercise safe for me? What are signs that I should stop exercising? Does my health condition mean that I should not exercise during pregnancy? When should I  avoid exercising during pregnancy? Stop exercising and contact a health care provider if: You have any unusual symptoms such as: Mild contractions of the uterus or cramps in the abdomen. A dizzy feeling that does not go away when you rest. Stop exercising and get help right away if: You have any unusual symptoms such as: Sudden, severe  pain in your low back or your belly. Regular, painful contractions of your uterus. Chest pain. Bleeding or fluid leaking from your vagina. Shortness of breath. Headache. Pain and swelling of your calves. Summary Most women should exercise regularly throughout pregnancy. In rare cases, women with certain medical conditions or complications may be asked to limit or avoid exercise during pregnancy. Do not exercise to lose weight during pregnancy. Your health care provider will tell you what level of physical activity is right for you. Stop exercising and contact a health care provider if you have unusual symptoms, such as mild contractions or dizziness. This information is not intended to replace advice given to you by your health care provider. Make sure you discuss any questions you have with your health care provider. Document Revised: 02/06/2020 Document Reviewed: 02/06/2020 Elsevier Patient Education  [place] for Pregnant Women While you are pregnant, your body requires additional nutrition to help support your growing baby. You also have a higher need for some vitamins and minerals, such as folic acid, calcium, iron, and vitamin D. Eating a healthy, well-balanced diet is very important for your health and your baby's health. Your need for extra calories varies over the course of your pregnancy. Pregnancy is divided into three trimesters, with each trimester lasting 3 months. For most women, it is recommended to consume: 150 extra calories a day during the first trimester. 300 extra calories a day during the second trimester. 300 extra calories a day during the third trimester. What are tips for following this plan? Cooking Practice good food safety and cleanliness. Wash your hands before you eat and after you prepare raw meat. Wash all fruits and vegetables well before peeling or eating. Taking these actions can help to prevent foodborne illnesses that can be very  dangerous to your baby, such as listeriosis. Ask your health care provider for more information about listeriosis. Make sure that all meats, poultry, and eggs are cooked to food-safe temperatures or "well-done." Meal planning  Eat a variety of foods (especially fruits and vegetables) to get a full range of vitamins and minerals. Two or more servings of fish are recommended each week in order to get the most benefits from omega-3 fatty acids that are found in seafood. Choose fish that are lower in mercury, such as salmon and pollock. Limit your overall intake of foods that have "empty calories." These are foods that have little nutritional value, such as sweets, desserts, candies, and sugar-sweetened beverages. Drinks that contain caffeine are okay to drink, but it is better to avoid caffeine. Keep your total caffeine intake to less than 200 mg each day (which is 12 oz or 355 mL of coffee, tea, or soda) or the limit as told by your health care provider. General information Do not try to lose weight or go on a diet during pregnancy. Take a prenatal vitamin to help meet your additional vitamin and mineral needs during pregnancy, specifically for folic acid, iron, calcium, and vitamin D. Remember to stay active. Ask your health care provider what types of exercise and activities are safe for you. What does 150 extra calories look like?  Healthy options that provide 150 extra calories each day could be any of the following: 6-8 oz (170-227 g) plain low-fat yogurt with  cup (70 g) berries. 1 apple with 2 tsp (11 g) peanut butter. Cut-up vegetables with  cup (60 g) hummus. 8 fl oz (237 mL) low-fat chocolate milk. 1 stick of string cheese with 1 medium orange. 1 peanut butter and jelly sandwich that is made with one slice of whole-wheat bread and 1 tsp (5 g) of peanut butter. For 300 extra calories, you could eat two of these healthy options each day. What is a healthy amount of weight to gain? The  right amount of weight gain for you is based on your BMI (body mass index) before you became pregnant. If your BMI was less than 18 (underweight), you should gain 28-40 lb (13-18 kg). If your BMI was 18-24.9 (normal), you should gain 25-35 lb (11-16 kg). If your BMI was 25-29.9 (overweight), you should gain 15-25 lb (7-11 kg). If your BMI was 30 or greater (obese), you should gain 11-20 lb (5-9 kg). What if I am having twins or multiples? Generally, if you are carrying twins or multiples: You may need to eat 300-600 extra calories a day. The recommended range for total weight gain is 25-54 lb (11-25 kg), depending on your BMI before pregnancy. Talk with your health care provider to find out about nutritional needs, weight gain, and exercise that is right for you. What foods should I eat?  Fruits All fruits. Eat a variety of colors and types of fruit. Remember to wash your fruits well before peeling or eating. Vegetables All vegetables. Eat a variety of colors and types of vegetables. Remember to wash your vegetables well before peeling or eating. Grains All grains. Choose whole grains, such as whole-wheat bread, oatmeal, or brown rice. Meats and other protein foods Lean meats, including chicken, [place], and lean cuts of beef, veal, or pork. Fish that is higher in omega-3 fatty acids and lower in mercury, such as salmon, herring, mussels, trout, sardines, pollock, shrimp, crab, and lobster. Tofu. Tempeh. Beans. Eggs. Peanut butter and other nut butters. Dairy Pasteurized milk and milk alternatives, such as almond milk. Pasteurized yogurt and pasteurized cheese. Cottage cheese. Sour cream. Beverages Water. Juices that contain 100% fruit juice or vegetable juice. Caffeine-free teas and decaffeinated coffee. Fats and oils Fats and oils are okay to include in moderation. Sweets and desserts Sweets and desserts are okay to include in moderation. Seasoning and other foods All pasteurized  condiments. The items listed above may not be a complete list of foods and beverages you can eat. Contact a dietitian for more information. What foods should I avoid? Fruits Raw (unpasteurized) fruit juices. Vegetables Unpasteurized vegetable juices. Meats and other protein foods Precooked or cured meat, such as bologna, hot dogs, sausages, or meat loaves. (If you must eat those meats, reheat them until they are steaming hot.) Refrigerated pate, meat spreads from a meat counter, or smoked seafood that is found in the refrigerated section of a store. Raw or undercooked meats, poultry, and eggs. Raw fish, such as sushi or sashimi. Fish that have high mercury content, such as tilefish, shark, swordfish, and king mackerel. Dairy Unpasteurized milk and any foods that have unpasteurized milk in them. Soft cheeses, such as feta, queso blanco, queso fresco, [place], [place], panela, and blue-veined cheeses (unless they are made with pasteurized milk, which must be stated on the label). Beverages Alcohol. Sugar-sweetened beverages, such as sodas, teas, or  energy drinks. Seasoning and other foods Homemade fermented foods and drinks, such as pickles, sauerkraut, or kombucha drinks. (Store-bought pasteurized versions of these are okay.) Salads that are made in a store or deli, such as ham salad, chicken salad, egg salad, tuna salad, and seafood salad. The items listed above may not be a complete list of foods and beverages you should avoid. Contact a dietitian for more information. Where to find more information To calculate the number of calories you need based on your height, weight, and activity level, you can use an online calculator such as: https://www.hunter.com/ To calculate how much weight you should gain during pregnancy, you can use an online pregnancy weight gain calculator such as: MassVoice.es To learn more about eating fish during pregnancy, talk with your health care provider or  visit: [place].[place] Summary While you are pregnant, your body requires additional nutrition to help support your growing baby. Eat a variety of foods, especially fruits and vegetables, to get a full range of vitamins and minerals. Practice good food safety and cleanliness. Wash your hands before you eat and after you prepare raw meat. Wash all fruits and vegetables well before peeling or eating. Taking these actions can help to prevent foodborne illnesses, such as listeriosis, that can be very dangerous to your baby. Do not eat raw meat or fish. Do not eat fish that have high mercury content, such as tilefish, shark, swordfish, and king mackerel. Do not eat raw (unpasteurized) dairy. Take a prenatal vitamin to help meet your additional vitamin and mineral needs during pregnancy, specifically for folic acid, iron, calcium, and vitamin D. This information is not intended to replace advice given to you by your health care provider. Make sure you discuss any questions you have with your health care provider. Document Revised: 01/17/2020 Document Reviewed: 01/17/2020 Elsevier Patient Education  [place].

## 2022-08-25 ENCOUNTER — Encounter: Payer: Self-pay | Admitting: Advanced Practice Midwife

## 2022-08-25 NOTE — Progress Notes (Signed)
[facility]  Date of Service: 08/24/2022  New Obstetric Patient H&P    Chief Complaint: "Desires prenatal care"   History of Present Illness: Patient is a 32 y.o. [id number] Not Hispanic or Latino female, presents with amenorrhea and positive home pregnancy test. Patient's last menstrual period was 04/10/2022 (exact date). and based on her LMP, her EDD is Estimated Date of Delivery: 01/15/23 and her EGA is [redacted]w[redacted]d Cycles are 5 days, regular, and occur approximately every : 28 days. Her last pap smear was 5 years ago and was no abnormalities.    She had a urine pregnancy test which was positive 3 month(s)  ago. Her last menstrual period was normal and lasted for  5 day(s). Since her LMP she claims she has experienced nausea and vomiting. She denies vaginal bleeding. Her past medical history is noncontributory. Her prior pregnancies are notable for  Full term SVDs; 6#15oz and 6#11oz. SAB in 2017.  Since her LMP, she admits to the use of tobacco products  no She claims she has gained  19  pounds since the start of her pregnancy.  There are cats in the home in the home  no  She admits close contact with children on a regular basis  yes  She has had chicken pox in the past yes She has had Tuberculosis exposures, symptoms, or previously tested positive for TB   no Current or past history of domestic violence. no  Genetic Screening/Teratology Counseling: (Includes patient, baby's father, or anyone in either family with:)   [facility] 2. Thalassemia ([place] or Asian background): MCV<80  no 3. Neural tube defect (meningomyelocele, spina bifida, anencephaly)  no 4. Congenital heart defect  no  5. Down syndrome  no 6. Tay-Sachs (Jewish, [place]  no 7. Canavan's Disease  no 8. Sickle cell disease or trait (African)  no  9. Hemophilia or other blood disorders  no  10. Muscular dystrophy  no  11. Cystic fibrosis  no  12. Huntington's Chorea   no  13. Mental retardation/autism  no 14. Other inherited genetic or chromosomal disorder  no 15. Maternal metabolic disorder (DM, PKU, etc)  no 16. Patient or FOB with a child with a birth defect not listed above no  16a. Patient or FOB with a birth defect themselves no 17. Recurrent pregnancy loss, or stillbirth  no  18. Any medications since LMP other than prenatal vitamins (include vitamins, supplements, OTC meds, drugs, alcohol)  no 19. Any other genetic/environmental exposure to discuss  no  Infection History:   1. Lives with someone with TB or TB exposed  no  2. Patient or partner has history of genital herpes  no 3. Rash or viral illness since LMP  no 4. History of STI (GC, CT, HPV, syphilis, HIV)  no 5. History of recent travel :  no  Other pertinent information:  no    Review of Systems:10 point review of systems negative unless otherwise noted in HPI  Past Medical History:  Patient Active Problem List   Diagnosis Date Noted   Supervision of other normal pregnancy, antepartum 08/16/2022     Clinical Staff Provider  Office Location  [facility] Dating  Not found.  Language  English Anatomy [place]   Flu Vaccine  offer Genetic Screen  NIPS:   TDaP vaccine   offer Hgb A1C or  GTT Early : Third trimester :   Covid declined   LAB  RESULTS   Rhogam  --/--/B POS Performed at [facility], [address] [place], [place] [address]  [phone] 2022)  Blood Type --/--/B POS Performed at [facility], [address] [place], [place] [address]  [phone] 2022)   Feeding Plan breast Antibody Negative (02/16 0914)  Contraception undecided Rubella 5.69 (02/16 0914)  Circumcision no RPR Non Reactive (02/16 0914)   Pediatrician  [facility] HBsAg Negative (02/16 0914)   Support Person Reggill HIV Non Reactive (02/16 0914)  Prenatal Classes yes Varicella     GBS  (For PCN allergy, check sensitivities)   BTL Consent  Hep C Non Reactive (02/16 0914)   VBAC  Consent  Pap No results found for: "DIAGPAP"    Hgb Electro      CF      SMA             Past Surgical History:  History reviewed. No pertinent surgical history.  Gynecologic History: Patient's last menstrual period was 04/10/2022 (exact date).  Obstetric History: [id number]  Family History:  Family History  Problem Relation Age of Onset   Healthy Mother    Healthy Father    Healthy Sister    Healthy Brother    Healthy Brother    Healthy Brother    Arthritis Maternal Grandmother    Healthy Maternal Grandfather    Healthy Paternal Grandmother    Healthy Paternal Grandfather     Social History:  Social History   Socioeconomic History   Marital status: Married    Spouse name: Reggill   Number of children: 2   Years of education: 13   Highest education level: Not on file  Occupational History   Occupation: unemployed  Tobacco Use   Smoking status: Never   Smokeless tobacco: Never  Vaping Use   Vaping Use: Never used  Substance and Sexual Activity   Alcohol use: No    Alcohol/week: 0.0 standard drinks of alcohol   Drug use: No   Sexual activity: Yes    Partners: Male    Birth control/protection: None  Other Topics Concern   Not on file  Social History Narrative   Not on file   Social Determinants of Health   Financial Resource Strain: Low Risk  (08/16/2022)   Overall Financial Resource Strain (CARDIA)    Difficulty of Paying Living Expenses: Not hard at all  Food Insecurity: No Food Insecurity (08/16/2022)   Hunger Vital Sign    Worried About Running Out of Food in the Last Year: Never true    Ran Out of Food in the Last Year: Never true  Transportation Needs: No Transportation Needs (08/16/2022)   PRAPARE - Hydrologist (Medical): No    Lack of Transportation (Non-Medical): No  Physical Activity: Insufficiently Active (08/16/2022)   Exercise Vital Sign    Days of Exercise per Week: 3 days    Minutes of Exercise per Session:  30 min  Stress: No Stress Concern Present (08/16/2022)   [place]    Feeling of Stress : Not at all  Social Connections: Moderately Integrated (08/16/2022)   Social Connection and Isolation Panel [NHANES]    Frequency of Communication with Friends and Family: More than three times a week    Frequency of Social Gatherings with Friends and Family: Twice a week    Attends Religious Services: More than 4 times per year    Active Member  of Clubs or Organizations: No    Attends Archivist Meetings: Never    Marital Status: Married  Human resources officer Violence: Not At Risk (08/16/2022)   Humiliation, Afraid, Rape, and Kick questionnaire    Fear of Current or Ex-Partner: No    Emotionally Abused: No    Physically Abused: No    Sexually Abused: No    Allergies:  No Known Allergies  Medications: Prior to Admission medications   Medication Sig Start Date End Date Taking? Authorizing Provider  Prenatal Vit-Fe Fumarate-FA (MULTIVITAMIN-PRENATAL) 27-0.8 MG TABS tablet Take 1 tablet by mouth daily at 12 noon.   Yes [provider]    Physical Exam Vitals: Blood pressure 111/73, pulse 96, weight 199 lb 14.4 oz (90.7 kg), last menstrual period 04/10/2022.  General: NAD HEENT: normocephalic, anicteric Thyroid: no enlargement, no palpable nodules Pulmonary: No increased work of breathing, CTAB Cardiovascular: RRR, distal pulses 2+ Abdomen: NABS, soft, non-tender, non-distended.  Umbilicus without lesions.  No hepatomegaly, splenomegaly or masses palpable. No evidence of hernia  Genitourinary:  External: Normal external female genitalia.  Normal urethral meatus, normal  Bartholin's and Skene's glands.    Vagina: Normal vaginal mucosa, no evidence of prolapse.    Cervix: Grossly normal in appearance, no bleeding  Uterus:  Non-enlarged, mobile, normal contour.  No CMT  Adnexa: ovaries non-enlarged, no adnexal  masses  Rectal: deferred Extremities: no edema, erythema, or tenderness Neurologic: Grossly intact Psychiatric: mood appropriate, affect full   The following were addressed during this visit:  Breastfeeding Education - Early initiation of breastfeeding    Comments: Keeps milk supply adequate, helps contract uterus and slow bleeding, and early milk is the perfect first food and is easy to digest.   - The importance of exclusive breastfeeding    Comments: Provides antibodies, Lower risk of breast and ovarian cancers, and type-2 diabetes,Helps your body recover, Reduced chance of SIDS.   - Risks of giving your baby anything other than breast milk if you are breastfeeding    Comments: Make the baby less content with breastfeeds, may make my baby more susceptible to illness, and may reduce my milk supply.   - The importance of early skin-to-skin contact    Comments:  Keeps baby warm and secure, helps keep baby's blood sugar up and breathing steady, easier to bond and breastfeed, and helps calm baby.  - Rooming-in on a 24-hour basis    Comments: Easier to learn baby's feeding cues, easier to bond and get to know each other, and encourages milk production.   - Feeding on demand or baby-led feeding    Comments: Helps prevent breastfeeding complications, helps bring in good milk supply, prevents under or overfeeding, and helps baby feel content and satisfied   - Frequent feeding to help assure optimal milk production    Comments: Making a full supply of milk requires frequent removal of milk from breasts, infant will eat 8-12 times in 24 hours, if separated from infant use breast massage, hand expression and/ or pumping to remove milk from breasts.   - Effective positioning and attachment    Comments: Helps my baby to get enough breast milk, helps to produce an adequate milk supply, and helps prevent nipple pain and damage   - Exclusive breastfeeding for the first 6 months     Comments: Builds a healthy milk supply and keeps it up, protects baby from sickness and disease, and breastmilk has everything your baby needs for the first 6 months.  -  Individualized Education    Comments: Contraindications to breastfeeding and other special medical conditions Patient has experience breastfeeding her other babies     Assessment: 4 y.o. [id number] at 70w3dpresenting to initiate prenatal care  Plan: 1) Avoid alcoholic beverages. 2) Patient encouraged not to smoke.  3) Discontinue the use of all non-medicinal drugs and chemicals.  4) Take prenatal vitamins daily.  5) Nutrition, food safety (fish, cheese advisories, and high nitrite foods) and exercise discussed. 6) Hospital and practice style discussed with cross coverage system.  7) Genetic Screening, such as with 1st Trimester Screening, cell free fetal DNA, AFP testing, and Ultrasound, as well as with amniocentesis and CVS as appropriate, is discussed with patient. At the conclusion of today's visit patient requested genetic testing 8) Patient is asked about travel to areas at risk for the Zika virus, and counseled to avoid travel and exposure to mosquitoes or sexual partners who may have themselves been exposed to the virus. Testing is discussed, and will be ordered as appropriate.  9) PAP smear today 10) Return to clinic in 4 weeks for ROB 11) Go to scheduled anatomy scan   JRod Can [facility] 08/25/2022, 11:41 AM

## 2022-08-26 ENCOUNTER — Ambulatory Visit
Admission: RE | Admit: 2022-08-26 | Discharge: 2022-08-26 | Disposition: A | Discharge status: Home / Self Care | Payer: Medicaid Other | Source: Ambulatory Visit | Attending: Advanced Practice Midwife | Admitting: Advanced Practice Midwife

## 2022-08-26 DIAGNOSIS — Z3480 Encounter for supervision of other normal pregnancy, unspecified trimester: Secondary | ICD-10-CM | POA: Insufficient documentation

## 2022-08-26 DIAGNOSIS — O321XX0 Maternal care for breech presentation, not applicable or unspecified: Secondary | ICD-10-CM | POA: Insufficient documentation

## 2022-08-26 DIAGNOSIS — Z3A20 20 weeks gestation of pregnancy: Secondary | ICD-10-CM | POA: Insufficient documentation

## 2022-08-26 DIAGNOSIS — Z369 Encounter for antenatal screening, unspecified: Secondary | ICD-10-CM | POA: Diagnosis not present

## 2022-08-26 LAB — MATERNIT 21 PLUS CORE, BLOOD
Fetal Fraction: 6
Result (T21): NEGATIVE
Trisomy 13 (Patau syndrome): NEGATIVE
Trisomy 18 (Edwards syndrome): NEGATIVE
Trisomy 21 (Down syndrome): NEGATIVE

## 2022-08-26 LAB — CYTOLOGY - PAP
Chlamydia: NEGATIVE
Comment: NEGATIVE
Comment: NEGATIVE
Comment: NEGATIVE
Comment: NORMAL
Diagnosis: NEGATIVE
High risk HPV: NEGATIVE
Neisseria Gonorrhea: NEGATIVE
Trichomonas: NEGATIVE

## 2022-09-03 ENCOUNTER — Other Ambulatory Visit: Payer: Self-pay | Admitting: Advanced Practice Midwife

## 2022-09-03 DIAGNOSIS — Z369 Encounter for antenatal screening, unspecified: Secondary | ICD-10-CM

## 2022-09-03 DIAGNOSIS — Z3480 Encounter for supervision of other normal pregnancy, unspecified trimester: Secondary | ICD-10-CM

## 2022-09-03 NOTE — Progress Notes (Signed)
F/u anatomy ordered for RVOT/LVOT

## 2022-09-20 ENCOUNTER — Ambulatory Visit
Admission: RE | Admit: 2022-09-20 | Discharge: 2022-09-20 | Disposition: A | Discharge status: Home / Self Care | Payer: Medicaid Other | Source: Ambulatory Visit | Attending: Advanced Practice Midwife | Admitting: Advanced Practice Midwife

## 2022-09-20 DIAGNOSIS — Z3A23 23 weeks gestation of pregnancy: Secondary | ICD-10-CM | POA: Insufficient documentation

## 2022-09-20 DIAGNOSIS — O321XX0 Maternal care for breech presentation, not applicable or unspecified: Secondary | ICD-10-CM | POA: Diagnosis not present

## 2022-09-20 DIAGNOSIS — Z3480 Encounter for supervision of other normal pregnancy, unspecified trimester: Secondary | ICD-10-CM | POA: Diagnosis present

## 2022-09-20 DIAGNOSIS — Z369 Encounter for antenatal screening, unspecified: Secondary | ICD-10-CM | POA: Diagnosis not present

## 2022-09-21 ENCOUNTER — Encounter: Payer: Self-pay | Admitting: Advanced Practice Midwife

## 2022-09-21 ENCOUNTER — Ambulatory Visit (INDEPENDENT_AMBULATORY_CARE_PROVIDER_SITE_OTHER): Payer: Medicaid Other | Admitting: Advanced Practice Midwife

## 2022-09-21 VITALS — BP 120/60 | Wt 203.0 lb

## 2022-09-21 DIAGNOSIS — Z3482 Encounter for supervision of other normal pregnancy, second trimester: Secondary | ICD-10-CM

## 2022-09-21 DIAGNOSIS — Z3A23 23 weeks gestation of pregnancy: Secondary | ICD-10-CM

## 2022-09-21 DIAGNOSIS — Z130 Encounter for screening for diseases of the blood and blood-forming organs and certain disorders involving the immune mechanism: Secondary | ICD-10-CM

## 2022-09-21 DIAGNOSIS — Z113 Encounter for screening for infections with a predominantly sexual mode of transmission: Secondary | ICD-10-CM

## 2022-09-21 DIAGNOSIS — Z369 Encounter for antenatal screening, unspecified: Secondary | ICD-10-CM

## 2022-09-21 DIAGNOSIS — Z131 Encounter for screening for diabetes mellitus: Secondary | ICD-10-CM

## 2022-09-21 NOTE — Progress Notes (Signed)
Routine Prenatal Care Visit  Subjective  Wanda Wyatt is a 32 y.o. [id number] at [redacted]w[redacted]d being seen today for ongoing prenatal care.  She is currently monitored for the following issues for this low-risk pregnancy and has Supervision of other normal pregnancy, antepartum on their problem list.  ----------------------------------------------------------------------------------- Patient reports she is doing well. Sometimes wakes up lying on her back. Reviewed recommendations and sleep comfort measures. She had follow up anatomy scan yesterday. Report not yet available. Per patient everything has been seen and is normal appearing. Reviewed 28 wk labs at next visit.   Contractions: Not present. Vag. Bleeding: None.  Movement: Present. Leaking Fluid denies.  ----------------------------------------------------------------------------------- The following portions of the patient's history were reviewed and updated as appropriate: allergies, current medications, past family history, past medical history, past social history, past surgical history and problem list. Problem list updated.  Objective  Blood pressure 120/60, weight 203 lb (92.1 kg), last menstrual period 04/10/2022. Pregravid weight 180 lb (81.6 kg) Total Weight Gain 23 lb (10.4 kg) Urinalysis: Urine Protein    Urine Glucose    Fetal Status: Fetal Heart Rate (bpm): 150 Fundal Height: 23 cm Movement: Present     General:  Alert, oriented and cooperative. Patient is in no acute distress.  Skin: Skin is warm and dry. No rash noted.   Cardiovascular: Normal heart rate noted  Respiratory: Normal respiratory effort, no problems with respiration noted  Abdomen: Soft, gravid, appropriate for gestational age. Pain/Pressure: Absent     Pelvic:  Cervical exam deferred        Extremities: Normal range of motion.  Edema: None  Mental Status: Normal mood and affect. Normal behavior. Normal judgment and thought content.   Assessment   32 y.o. [id number] at  [redacted]w[redacted]d by  01/15/2023, by Last Menstrual Period presenting for routine prenatal visit  Plan   FOURTH Problems (from 08/16/22 to present)     Problem Noted Resolved   Supervision of other normal pregnancy, antepartum 08/16/2022 by Cleophas Dunker, CMA No   Overview Addendum 08/24/2022 10:25 AM by Landis Gandy, [place] Staff Provider  Office Location  [facility] Dating  Not found.  Language  English Anatomy US    Flu Vaccine  offer Genetic Screen  NIPS:   TDaP vaccine   offer Hgb A1C or  GTT Early : Third trimester :   Covid declined   LAB RESULTS   Rhogam  --/--/B POS Performed at [facility], [address] [place], [place] [address]  [phone] 2022)  Blood Type --/--/B POS Performed at [facility], [address] [place], [place] [address]  [phone] 2022)   Feeding Plan breast Antibody Negative (02/16 0914)  Contraception undecided Rubella 5.69 (02/16 0914)  Circumcision no RPR Non Reactive (02/16 0914)   Pediatrician  [facility] HBsAg Negative (02/16 0914)   Support Person Reggill HIV Non Reactive (02/16 0914)  Prenatal Classes yes Varicella     GBS  (For PCN allergy, check sensitivities)   BTL Consent  Hep C Non Reactive (02/16 0914)   VBAC Consent  Pap No results found for: "DIAGPAP"    Hgb Electro      CF      SMA                   Preterm labor symptoms and general obstetric precautions including but not limited to vaginal bleeding, contractions, leaking of fluid and fetal movement were reviewed in detail  with the patient. Please refer to After Visit Summary for other counseling recommendations.   Return in about 4 weeks (around 10/19/2022) for 28 wk labs and rob.  Rod Can, CNM 09/21/2022 9:18 AM

## 2022-10-20 ENCOUNTER — Other Ambulatory Visit: Payer: Medicaid Other

## 2022-10-20 ENCOUNTER — Encounter: Payer: Self-pay | Admitting: Obstetrics

## 2022-10-20 ENCOUNTER — Ambulatory Visit (INDEPENDENT_AMBULATORY_CARE_PROVIDER_SITE_OTHER): Payer: Medicaid Other | Admitting: Obstetrics

## 2022-10-20 VITALS — BP 107/61 | HR 83 | Wt 208.8 lb

## 2022-10-20 DIAGNOSIS — Z3482 Encounter for supervision of other normal pregnancy, second trimester: Secondary | ICD-10-CM

## 2022-10-20 DIAGNOSIS — Z130 Encounter for screening for diseases of the blood and blood-forming organs and certain disorders involving the immune mechanism: Secondary | ICD-10-CM

## 2022-10-20 DIAGNOSIS — Z131 Encounter for screening for diabetes mellitus: Secondary | ICD-10-CM | POA: Diagnosis not present

## 2022-10-20 DIAGNOSIS — Z3A27 27 weeks gestation of pregnancy: Secondary | ICD-10-CM

## 2022-10-20 DIAGNOSIS — Z113 Encounter for screening for infections with a predominantly sexual mode of transmission: Secondary | ICD-10-CM

## 2022-10-20 DIAGNOSIS — Z369 Encounter for antenatal screening, unspecified: Secondary | ICD-10-CM | POA: Diagnosis not present

## 2022-10-20 DIAGNOSIS — Z3480 Encounter for supervision of other normal pregnancy, unspecified trimester: Secondary | ICD-10-CM

## 2022-10-20 LAB — POCT URINALYSIS DIPSTICK OB
Bilirubin, UA: NEGATIVE
Blood, UA: NEGATIVE
Glucose, UA: NEGATIVE
Ketones, UA: NEGATIVE
Leukocytes, UA: NEGATIVE
Nitrite, UA: NEGATIVE
POC,PROTEIN,UA: NEGATIVE
Spec Grav, UA: <=1.005 — AB (ref 1.010–1.025)
Urobilinogen, UA: 0.2 E.U./dL
pH, UA: 6.5 (ref 5.0–8.0)

## 2022-10-20 NOTE — Progress Notes (Signed)
Routine Prenatal Care Visit  Subjective  Wanda Wyatt is a 32 y.o. [id number] at [redacted]w[redacted]d being seen today for ongoing prenatal care.  She is currently monitored for the following issues for this low-risk pregnancy and has Supervision of other normal pregnancy, antepartum on their problem list.  ----------------------------------------------------------------------------------- Patient reports no complaints.   Contractions: Not present. Vag. Bleeding: None.  Movement: Present. Leaking Fluid denies.  ----------------------------------------------------------------------------------- The following portions of the patient's history were reviewed and updated as appropriate: allergies, current medications, past family history, past medical history, past social history, past surgical history and problem list. Problem list updated.  Objective  Blood pressure 107/61, pulse 83, weight 208 lb 12.8 oz (94.7 kg), last menstrual period 04/10/2022. Pregravid weight 180 lb (81.6 kg) Total Weight Gain 28 lb 12.8 oz (13.1 kg) Urinalysis: Urine Protein    Urine Glucose    Fetal Status:     Movement: Present     General:  Alert, oriented and cooperative. Patient is in no acute distress.  Skin: Skin is warm and dry. No rash noted.   Cardiovascular: Normal heart rate noted  Respiratory: Normal respiratory effort, no problems with respiration noted  Abdomen: Soft, gravid, appropriate for gestational age. Pain/Pressure: Absent     Pelvic:  Cervical exam deferred        Extremities: Normal range of motion.  Edema: None  Mental Status: Normal mood and affect. Normal behavior. Normal judgment and thought content.   Assessment   32 y.o. [id number] at [redacted]w[redacted]d by  01/15/2023, by Last Menstrual Period presenting for routine prenatal visit  Plan   FOURTH Problems (from 08/16/22 to present)     Problem Noted Resolved   Supervision of other normal pregnancy, antepartum 08/16/2022 by Loran Senters, CMA No   Overview Addendum  10/20/2022  9:59 AM by Mirna Mires, CNM     Clinical Staff Provider  Office Location  [facility] Dating  boy  Language  English Anatomy US  Normal female  Flu Vaccine  offer Genetic Screen  NIPS: xy, negative  TDaP vaccine   offer Hgb A1C or  GTT Early : Third trimester : 4/17  Covid declined   LAB RESULTS   Rhogam  --/--/B POS Performed at [facility], [address] [place], [place] [address]  [phone] 2022)  Blood Type --/--/B POS Performed at [facility], [address] [place], [place] [address]  [phone] 2022)   Feeding Plan breast Antibody Negative (02/16 0914)  Contraception Undecided/vasectomy Rubella 5.69 (02/16 0914)  Circumcision no RPR Non Reactive (02/16 0914)   Pediatrician  [facility] HBsAg Negative (02/16 0914)   Support Person Reggill HIV Non Reactive (02/16 0914)  Prenatal Classes yes Varicella     GBS  (For PCN allergy, check sensitivities)   BTL Consent  Hep C Non Reactive (02/16 0914)   VBAC Consent  Pap No results found for: "DIAGPAP"    Hgb Electro      CF      SMA                   Preterm labor symptoms and general obstetric precautions including but not limited to vaginal bleeding, contractions, leaking of fluid and fetal movement were reviewed in detail with the patient. Please refer to After Visit Summary for other counseling recommendations.  Having her 28 week labs today.  Return in about 2 weeks (around 11/03/2022) for return OB.  Mirna Mires, CNM  10/20/2022 9:59 AM

## 2022-10-21 ENCOUNTER — Encounter: Payer: Self-pay | Admitting: Obstetrics

## 2022-10-21 LAB — 28 WEEK RH+PANEL
Basophils Absolute: 0 10*3/uL (ref 0.0–0.2)
Basos: 0 %
EOS (ABSOLUTE): 0.1 10*3/uL (ref 0.0–0.4)
Eos: 1 %
Gestational Diabetes Screen: 106 mg/dL (ref 70–139)
HIV Screen 4th Generation wRfx: NONREACTIVE
Hematocrit: 35.2 % (ref 34.0–46.6)
Hemoglobin: 11.9 g/dL (ref 11.1–15.9)
Immature Grans (Abs): 0.1 10*3/uL (ref 0.0–0.1)
Immature Granulocytes: 1 %
Lymphocytes Absolute: 1.3 10*3/uL (ref 0.7–3.1)
Lymphs: 18 %
MCH: 28.5 pg (ref 26.6–33.0)
MCHC: 33.8 g/dL (ref 31.5–35.7)
MCV: 84 fL (ref 79–97)
Monocytes Absolute: 0.4 10*3/uL (ref 0.1–0.9)
Monocytes: 6 %
Neutrophils Absolute: 5.3 10*3/uL (ref 1.4–7.0)
Neutrophils: 74 %
Platelets: 279 10*3/uL (ref 150–450)
RBC: 4.18 x10E6/uL (ref 3.77–5.28)
RDW: 13 % (ref 11.7–15.4)
RPR Ser Ql: NONREACTIVE
WBC: 7.2 10*3/uL (ref 3.4–10.8)

## 2022-11-04 ENCOUNTER — Ambulatory Visit (INDEPENDENT_AMBULATORY_CARE_PROVIDER_SITE_OTHER): Payer: Medicaid Other | Admitting: Obstetrics

## 2022-11-04 ENCOUNTER — Encounter: Payer: Self-pay | Admitting: Obstetrics and Gynecology

## 2022-11-04 VITALS — BP 97/67 | HR 88 | Wt 210.0 lb

## 2022-11-04 DIAGNOSIS — Z3483 Encounter for supervision of other normal pregnancy, third trimester: Secondary | ICD-10-CM

## 2022-11-04 DIAGNOSIS — Z3A29 29 weeks gestation of pregnancy: Secondary | ICD-10-CM

## 2022-11-04 LAB — POCT URINALYSIS DIPSTICK OB
Bilirubin, UA: NEGATIVE
Blood, UA: NEGATIVE
Glucose, UA: NEGATIVE
Ketones, UA: NEGATIVE
Leukocytes, UA: NEGATIVE
Nitrite, UA: NEGATIVE
Spec Grav, UA: 1.015 (ref 1.010–1.025)
Urobilinogen, UA: 1 E.U./dL
pH, UA: 7.5 (ref 5.0–8.0)

## 2022-11-04 NOTE — Progress Notes (Signed)
ROB at [redacted]w[redacted]d. Carolan is feeling well. Active baby. Denies ctx, LOF, and vaginal bleeding. Discussed making a birth plan. She would like to avoid induction. Desires epidural. Her husband plans a vasectomy. RTC in 2 weeks.  Wanda Wyatt, CNM

## 2022-11-08 ENCOUNTER — Encounter: Payer: Self-pay | Admitting: Obstetrics

## 2022-11-18 ENCOUNTER — Ambulatory Visit (INDEPENDENT_AMBULATORY_CARE_PROVIDER_SITE_OTHER): Payer: Medicaid Other | Admitting: Certified Nurse Midwife

## 2022-11-18 ENCOUNTER — Encounter: Payer: Self-pay | Admitting: Certified Nurse Midwife

## 2022-11-18 VITALS — BP 101/70 | HR 92 | Wt 213.6 lb

## 2022-11-18 DIAGNOSIS — Z3403 Encounter for supervision of normal first pregnancy, third trimester: Secondary | ICD-10-CM

## 2022-11-18 DIAGNOSIS — Z3A31 31 weeks gestation of pregnancy: Secondary | ICD-10-CM

## 2022-11-18 NOTE — Patient Instructions (Signed)
Preterm Labor Pregnancy normally lasts 39-41 weeks. Preterm labor is when labor starts before you have been pregnant for 37 weeks. Babies who are born too early may have a higher risk for long-term problems like cerebral palsy or developmental delays. They may also have problems soon after birth, such as problems with blood sugar, body temperature, heart, and breathing. These problems may be very serious in babies who are born before 34 weeks of pregnancy. What are the causes? The cause of this condition is not known. What increases the risk? You are more likely to have preterm labor if: You have medical problems, now or in the past. You have problems now or in your past pregnancies. You have lifestyle problems. Medical history You have problems of the womb (uterus). You have an infection, including infections you get from sex. You have problems that do not go away, such as: Blood clots. High blood pressure. High blood sugar. You have low body weight or too much body weight. Present and past pregnancies You have had preterm labor before. You are pregnant with two babies or more. You have a condition in which the placenta covers your cervix. You waited less than 18 months between giving birth and becoming pregnant again. Your unborn baby has some problems. You have bleeding from your vagina. You became pregnant by a method called IVF. Lifestyle You smoke. You drink alcohol. You use drugs. You have stress. You have abuse in your home. You come in contact with chemicals that harm the body (pollutants). Other factors You are younger than 17 years or older than 35 years. What are the signs or symptoms? Symptoms of this condition include: Cramps. The cramps may feel like cramps from a period. You may also have watery poop (diarrhea). Pain in the belly (abdomen). Pain in the lower back. Regular contractions. It may feel like your belly is getting tighter. Pressure in the lower  belly. More fluid leaking from the vagina. The fluid may be watery or bloody. Water breaking. How is this treated? Treatment for this condition depends on your health, the health of your baby, and how old your pregnancy is. It may include: Taking medicines, such as: Hormone medicines. Medicines to stop contractions. Medicines to help mature the baby's lungs. Medicines to prevent your baby from getting cerebral palsy or other problems. Bed rest. If the labor happens before 34 weeks of pregnancy, you may need to stay in the hospital. Delivering the baby. Follow these instructions at home:  Do not smoke or use any products that contain nicotine or tobacco. If you need help quitting, ask your doctor. Do not drink alcohol. Take over-the-counter and prescription medicines only as told by your doctor. Rest as told by your doctor. Return to your normal activities when your doctor says that it is safe. Keep all follow-up visits. How is this prevented? To have a healthy pregnancy: Do not use drugs. Do not use any medicines unless you ask your doctor if they are safe for you. Talk with your doctor before taking any herbal supplements. Make sure you gain enough weight. Watch for infection. If you think you might have an infection, get it checked right away. Symptoms of infection may include: Fever. Vaginal discharge that smells bad or is not normal. Pain or burning when you pee. Needing to pee urgently. Needing to pee often. Peeing small amounts often. Blood in your pee. Pee that smells bad or unusual. Where to find more information U.S. Department of Health and Human Services   Office on Women's Health: www.womenshealth.gov The American College of Obstetricians and Gynecologists: www.acog.org Centers for Disease Control and Prevention: www.cdc.gov Contact a doctor if: You think you are going into preterm labor. You have symptoms of preterm labor. You have symptoms of infection. Get help  right away if: You are having painful contractions every 5 minutes or less. Your water breaks. Summary Preterm labor is labor that starts before you reach 37 weeks of pregnancy. Your baby may have problems if delivered early. You are more likely to have preterm labor if you have certain medical problems or problems with a pregnancy now or in the past. Some lifestyle factors can also increase the risk. Contact a doctor if you have symptoms of preterm labor. This information is not intended to replace advice given to you by your health care provider. Make sure you discuss any questions you have with your health care provider. Document Revised: 06/24/2020 Document Reviewed: 06/24/2020 Elsevier Patient Education  2023 Elsevier Inc.  

## 2022-11-18 NOTE — Progress Notes (Signed)
ROB doing well, denies any concerns today. State she feels good fetal movement. Reviewed PTL precautions. Follow up 2 wk or prn.   Doreene Burke, CNM

## 2022-12-03 ENCOUNTER — Ambulatory Visit (INDEPENDENT_AMBULATORY_CARE_PROVIDER_SITE_OTHER): Payer: Medicaid Other | Admitting: Certified Nurse Midwife

## 2022-12-03 VITALS — BP 120/80 | Wt 217.0 lb

## 2022-12-03 DIAGNOSIS — Z3483 Encounter for supervision of other normal pregnancy, third trimester: Secondary | ICD-10-CM

## 2022-12-03 DIAGNOSIS — Z3480 Encounter for supervision of other normal pregnancy, unspecified trimester: Secondary | ICD-10-CM

## 2022-12-03 DIAGNOSIS — Z3A33 33 weeks gestation of pregnancy: Secondary | ICD-10-CM

## 2022-12-03 NOTE — Progress Notes (Signed)
   PRENATAL VISIT NOTE  Subjective:  Wanda Wyatt is a 32 y.o. [id number] at [redacted]w[redacted]d being seen today for ongoing prenatal care.  She is currently monitored for the following issues for this low-risk pregnancy and has Supervision of other normal pregnancy, antepartum on their problem list.  Patient reports no complaints.  Contractions: Not present. Vag. Bleeding: None.  Movement: Present. Denies leaking of fluid.  Declines TDAP.  The following portions of the patient's history were reviewed and updated as appropriate: allergies, current medications, past family history, past medical history, past social history, past surgical history and problem list.   Objective:   Vitals:   12/03/22 0818  BP: 120/80  Weight: 217 lb (98.4 kg)   Total weight gain: 37 lb (16.8 kg) Fetal Status:     Movement: Present      General:  Alert, oriented and cooperative. Patient is in no acute distress.  Skin: Skin is warm and dry. No rash noted.   Cardiovascular: Normal heart rate noted  Respiratory: Normal respiratory effort, no problems with respiration noted  Abdomen: Soft, gravid, appropriate for gestational age.  Pain/Pressure: Absent     Pelvic: Cervical exam deferred        Extremities: Normal range of motion.  Edema: None  Mental Status: Normal mood and affect. Normal behavior. Normal judgment and thought content.   Assessment and Plan:  Pregnancy: [id number] at [redacted]w[redacted]d 1. Encounter for supervision of other normal pregnancy, third trimester  2. [redacted] weeks gestation of pregnancy   Preterm labor symptoms and general obstetric precautions including but not limited to vaginal bleeding, contractions, leaking of fluid and fetal movement were reviewed in detail with the patient. Please refer to After Visit Summary for other counseling recommendations.  Reviewed GBS & GC/Ct at next visit. Will bring birth preferences.  Return in 2 weeks (on 12/17/2022) for ROB.  Future Appointments  Date Time Provider  Department Center  12/17/2022  8:15 AM Mirna Mires, CNM [facility] None  12/24/2022  8:55 AM Hildred Laser, MD [facility] None  12/31/2022  8:15 AM Doreene Burke, CNM [facility] None    Dominica Severin, CNM

## 2022-12-03 NOTE — Patient Instructions (Signed)

## 2022-12-17 ENCOUNTER — Ambulatory Visit (INDEPENDENT_AMBULATORY_CARE_PROVIDER_SITE_OTHER): Payer: Medicaid Other | Admitting: Obstetrics

## 2022-12-17 ENCOUNTER — Other Ambulatory Visit (HOSPITAL_COMMUNITY)
Admission: RE | Admit: 2022-12-17 | Discharge: 2022-12-17 | Disposition: A | Discharge status: Home / Self Care | Payer: Medicaid Other | Source: Ambulatory Visit | Attending: Obstetrics | Admitting: Obstetrics

## 2022-12-17 VITALS — BP 93/63 | HR 88 | Wt 220.0 lb

## 2022-12-17 DIAGNOSIS — Z3480 Encounter for supervision of other normal pregnancy, unspecified trimester: Secondary | ICD-10-CM | POA: Insufficient documentation

## 2022-12-17 DIAGNOSIS — Z3483 Encounter for supervision of other normal pregnancy, third trimester: Secondary | ICD-10-CM

## 2022-12-17 DIAGNOSIS — Z113 Encounter for screening for infections with a predominantly sexual mode of transmission: Secondary | ICD-10-CM

## 2022-12-17 DIAGNOSIS — Z3685 Encounter for antenatal screening for Streptococcus B: Secondary | ICD-10-CM

## 2022-12-17 DIAGNOSIS — Z3A35 35 weeks gestation of pregnancy: Secondary | ICD-10-CM

## 2022-12-17 LAB — POCT URINALYSIS DIPSTICK OB
Bilirubin, UA: NEGATIVE
Blood, UA: NEGATIVE
Glucose, UA: NEGATIVE
Ketones, UA: NEGATIVE
Leukocytes, UA: NEGATIVE
Nitrite, UA: NEGATIVE
Spec Grav, UA: 1.01 (ref 1.010–1.025)
Urobilinogen, UA: 0.2 E.U./dL
pH, UA: 6.5 (ref 5.0–8.0)

## 2022-12-17 NOTE — Progress Notes (Signed)
Routine Prenatal Care Visit  Subjective  Wanda Wyatt is a 32 y.o. [id number] at [redacted]w[redacted]d being seen today for ongoing prenatal care.  She is currently monitored for the following issues for this low-risk pregnancy and has Supervision of other normal pregnancy, antepartum on their problem list.  ----------------------------------------------------------------------------------- Patient reports no complaints.   Contractions: Irregular. Vag. Bleeding: None.  Movement: Present. Leaking Fluid denies.  ----------------------------------------------------------------------------------- The following portions of the patient's history were reviewed and updated as appropriate: allergies, current medications, past family history, past medical history, past social history, past surgical history and problem list. Problem list updated.  Objective  Blood pressure 93/63, pulse 88, weight 220 lb (99.8 kg), last menstrual period 04/10/2022. Pregravid weight 180 lb (81.6 kg) Total Weight Gain 40 lb (18.1 kg) Urinalysis: Urine Protein    Urine Glucose    Fetal Status:     Movement: Present     General:  Alert, oriented and cooperative. Patient is in no acute distress.  Skin: Skin is warm and dry. No rash noted.   Cardiovascular: Normal heart rate noted  Respiratory: Normal respiratory effort, no problems with respiration noted  Abdomen: Soft, gravid, appropriate for gestational age. Pain/Pressure: Absent     Pelvic:  Cervical exam deferred        Extremities: Normal range of motion.     Mental Status: Normal mood and affect. Normal behavior. Normal judgment and thought content.   Assessment   32 y.o. [id number] at [redacted]w[redacted]d by  01/15/2023, by Last Menstrual Period presenting for routine prenatal visit  Plan   FOURTH Problems (from 08/16/22 to present)     Problem Noted Resolved   Supervision of other normal pregnancy, antepartum 08/16/2022 by Loran Senters, CMA No   Overview Addendum 12/17/2022  8:21 AM by Mirna Mires, CNM     Clinical Staff Provider  Office Location  [facility] Dating  boy  Language  English Anatomy US  Normal female  Flu Vaccine  offer Genetic Screen  NIPS: xy, negative  TDaP vaccine  declined Hgb A1C or  GTT Early : Third trimester : 4/17  Covid declined   LAB RESULTS   Rhogam  --/--/B POS Performed at [facility], [address] [place], [place] [address]  [phone] 2022)  Blood Type --/--/B POS Performed at [facility], [address] [place], [place] [address]  [phone] 2022)   Feeding Plan breast Antibody Negative (02/16 0914)  Contraception Undecided/vasectomy Rubella 5.69 (02/16 0914)  Circumcision no RPR Non Reactive (02/16 0914)   Pediatrician  [facility] HBsAg Negative (02/16 0914)   Support Person [facility] HIV Non Reactive (02/16 0914)  Prenatal Classes yes Varicella immune    GBS  (For PCN allergy, check sensitivities)   BTL Consent  Hep C Non Reactive (02/16 0914)   VBAC Consent  Pap No results found for: "DIAGPAP"    Hgb Electro      CF      SMA                   Preterm labor symptoms and general obstetric precautions including but not limited to vaginal bleeding, contractions, leaking of fluid and fetal movement were reviewed in detail with the patient. Please refer to After Visit Summary for other counseling recommendations.  GBS and cultures done today.  Return in about 1 week (around 12/24/2022) for return OB.  Mirna Mires, CNM  12/17/2022 8:21 AM

## 2022-12-17 NOTE — Addendum Note (Signed)
Addended by: Donnetta Hail on: 12/17/2022 08:47 AM   Modules accepted: Orders

## 2022-12-19 LAB — STREP GP B NAA: Strep Gp B NAA: NEGATIVE

## 2022-12-20 LAB — CERVICOVAGINAL ANCILLARY ONLY
Bacterial Vaginitis (gardnerella): NEGATIVE
Candida Glabrata: NEGATIVE
Candida Vaginitis: NEGATIVE
Chlamydia: NEGATIVE
Comment: NEGATIVE
Comment: NEGATIVE
Comment: NEGATIVE
Comment: NEGATIVE
Comment: NEGATIVE
Comment: NORMAL
Neisseria Gonorrhea: NEGATIVE
Trichomonas: NEGATIVE

## 2022-12-24 ENCOUNTER — Ambulatory Visit (INDEPENDENT_AMBULATORY_CARE_PROVIDER_SITE_OTHER): Payer: Medicaid Other | Admitting: Obstetrics and Gynecology

## 2022-12-24 VITALS — BP 100/66 | HR 89 | Wt 225.0 lb

## 2022-12-24 DIAGNOSIS — Z3A36 36 weeks gestation of pregnancy: Secondary | ICD-10-CM

## 2022-12-24 DIAGNOSIS — O99210 Obesity complicating pregnancy, unspecified trimester: Secondary | ICD-10-CM | POA: Insufficient documentation

## 2022-12-24 DIAGNOSIS — Z3483 Encounter for supervision of other normal pregnancy, third trimester: Secondary | ICD-10-CM

## 2022-12-24 LAB — POCT URINALYSIS DIPSTICK OB
Appearance: NORMAL
Bilirubin, UA: NEGATIVE
Blood, UA: NEGATIVE
Glucose, UA: NEGATIVE
Ketones, UA: NEGATIVE
Leukocytes, UA: NEGATIVE
Nitrite, UA: NEGATIVE
POC,PROTEIN,UA: NEGATIVE
Spec Grav, UA: 1.015 (ref 1.010–1.025)
Urobilinogen, UA: 0.2 E.U./dL
pH, UA: 6 (ref 5.0–8.0)

## 2022-12-24 NOTE — Progress Notes (Signed)
ROB: Patient is a 32 y.o. [id number] at [redacted]w[redacted]d who presents for routine OB care.  Pregnancy is uncomplicated. Patient has complaints of pelvic pressure and [facility], but are not consistent. Reviewed labor precautions. Reviewed labs, 36 week cultures negative. RTC in 1 week.

## 2022-12-31 ENCOUNTER — Ambulatory Visit (INDEPENDENT_AMBULATORY_CARE_PROVIDER_SITE_OTHER): Payer: Medicaid Other | Admitting: Certified Nurse Midwife

## 2022-12-31 VITALS — BP 108/76 | HR 86 | Wt 222.3 lb

## 2022-12-31 DIAGNOSIS — Z3483 Encounter for supervision of other normal pregnancy, third trimester: Secondary | ICD-10-CM

## 2022-12-31 DIAGNOSIS — Z3A37 37 weeks gestation of pregnancy: Secondary | ICD-10-CM

## 2022-12-31 LAB — POCT URINALYSIS DIPSTICK OB
Bilirubin, UA: NEGATIVE
Blood, UA: NEGATIVE
Glucose, UA: NEGATIVE
Ketones, UA: NEGATIVE
Leukocytes, UA: NEGATIVE
Nitrite, UA: NEGATIVE
POC,PROTEIN,UA: NEGATIVE
Spec Grav, UA: 1.015 (ref 1.010–1.025)
Urobilinogen, UA: 0.2 E.U./dL
pH, UA: 7 (ref 5.0–8.0)

## 2022-12-31 NOTE — Patient Instructions (Signed)
.  Braxton Hicks Contractions  Contractions of the uterus can occur throughout pregnancy, but they are not always a sign that you are in labor. You may have practice contractions called Braxton Hicks contractions. These false labor contractions are sometimes confused with true labor. What are Braxton Hicks contractions? Braxton Hicks contractions are tightening movements that occur in the muscles of the uterus before labor. Unlike true labor contractions, these contractions do not result in opening (dilation) and thinning of the lowest part of the uterus (cervix). Toward the end of pregnancy (32-34 weeks), Braxton Hicks contractions can happen more often and may become stronger. These contractions are sometimes difficult to tell apart from true labor because they can be very uncomfortable. How to tell the difference between true labor and false labor True labor Contractions last 30-70 seconds. Contractions become very regular. Discomfort is usually felt in the top of the uterus, and it spreads to the lower abdomen and low back. Contractions do not go away with walking. Contractions usually become stronger and more frequent. The cervix dilates and gets thinner. False labor Contractions are usually shorter, weaker, and farther apart than true labor contractions. Contractions are usually irregular. Contractions are often felt in the front of the lower abdomen and in the groin. Contractions may go away when you walk around or change positions while lying down. The cervix usually does not dilate or become thin. Sometimes, the only way to tell if you are in true labor is for your health care provider to look for changes in your cervix. Your health care provider will do a physical exam and may monitor your contractions. If you are in true labor, your health care provider will send you home with instructions about when to return to the hospital. You may continue to have Braxton Hicks contractions until  you go into true labor. Follow these instructions at home:  Take over-the-counter and prescription medicines only as told by your health care provider. If Braxton Hicks contractions are making you uncomfortable: Change your position from lying down or resting to walking, or change from walking to resting. Sit and rest in a tub of warm water. Drink enough fluid to keep your urine pale yellow. Dehydration may cause these contractions. Do slow and deep breathing several times an hour. Keep all follow-up visits. This is important. Contact a health care provider if: You have a fever. You have continuous pain in your abdomen. Your contractions become stronger, more regular, and closer together. You pass blood-tinged mucus. Get help right away if: You have fluid leaking or gushing from your vagina. You have bright red blood coming from your vagina. Your baby is not moving inside you as much as it used to. Summary You may have practice contractions called Braxton Hicks contractions. These false labor contractions are sometimes confused with true labor. Braxton Hicks contractions are usually shorter, weaker, farther apart, and less regular than true labor contractions. True labor contractions usually become stronger, more regular, and more frequent. Manage discomfort from Braxton Hicks contractions by changing position, resting in a warm bath, practicing deep breathing, and drinking plenty of water. Keep all follow-up visits. Contact your health care provider if your contractions become stronger, more regular, and closer together. This information is not intended to replace advice given to you by your health care provider. Make sure you discuss any questions you have with your health care provider. Document Revised: 04/28/2020 Document Reviewed: 04/28/2020 Elsevier Patient Education  2024 [employer]vier Inc.  

## 2022-12-31 NOTE — Progress Notes (Signed)
ROB doing well. Discussed labor precautions. Herbal prep handout given. She is feeling good movement. Declines SVE today   Follow up 1 wk for ROB.   Doreene Burke, CNM

## 2023-01-07 ENCOUNTER — Encounter: Payer: Self-pay | Admitting: Obstetrics and Gynecology

## 2023-01-07 ENCOUNTER — Ambulatory Visit (INDEPENDENT_AMBULATORY_CARE_PROVIDER_SITE_OTHER): Payer: Medicaid Other | Admitting: Obstetrics and Gynecology

## 2023-01-07 VITALS — BP 109/71 | HR 89 | Wt 229.7 lb

## 2023-01-07 DIAGNOSIS — Z3483 Encounter for supervision of other normal pregnancy, third trimester: Secondary | ICD-10-CM

## 2023-01-07 DIAGNOSIS — O99210 Obesity complicating pregnancy, unspecified trimester: Secondary | ICD-10-CM

## 2023-01-07 DIAGNOSIS — Z3A38 38 weeks gestation of pregnancy: Secondary | ICD-10-CM

## 2023-01-07 LAB — POCT URINALYSIS DIPSTICK OB
Bilirubin, UA: NEGATIVE
Blood, UA: NEGATIVE
Glucose, UA: NEGATIVE
Ketones, UA: NEGATIVE
Leukocytes, UA: NEGATIVE
Nitrite, UA: NEGATIVE
Spec Grav, UA: 1.01
Urobilinogen, UA: 0.2 U/dL
pH, UA: 7

## 2023-01-07 NOTE — Progress Notes (Signed)
ROB: Patient is a 32 y.o. [id number] at [redacted]w[redacted]d who presents for routine OB care.  Pregnancy is uncomplicated. Patient has complaints of pelvic pressure and occasional Deberah Pelton. Reviewed labor precautions. RTC in 1 week. Can discuss IOL if appearing to go postdates no labor at next visit.

## 2023-01-07 NOTE — Progress Notes (Signed)
ROB [redacted]w[redacted]d: She is doing well. She reports good fetal movement. She has no new concerns today.

## 2023-01-07 NOTE — Patient Instructions (Signed)
Natural Cervical Ripening Supplements ? ?Red Raspberry Leaf Tea ?2-4 cups daily.  Start at [redacted] weeks gestation and continue until labor.  ? ? ? ?Evening Primrose Oil (1000 mg) ?Take twice daily - by mouth in the morning and vaginally at bedtime. (If previous C-section, take both doses by mouth) ?Start at 38 weeks and continue until labor ? ? ? ?Blue Cohosh ?1 capsule daily starting at [redacted] weeks gestation ?2 capsules daily starting at [redacted] weeks gestation ?3 capsules daily starting at [redacted] weeks gestation ? ?

## 2023-01-10 ENCOUNTER — Encounter: Payer: Self-pay | Admitting: Obstetrics and Gynecology

## 2023-01-10 ENCOUNTER — Observation Stay
Admission: EM | Admit: 2023-01-10 | Discharge: 2023-01-11 | Disposition: A | Discharge status: Home / Self Care | Payer: Medicaid Other | Source: Home / Self Care | Admitting: Obstetrics and Gynecology

## 2023-01-10 ENCOUNTER — Other Ambulatory Visit: Payer: Self-pay

## 2023-01-10 DIAGNOSIS — O42913 Preterm premature rupture of membranes, unspecified as to length of time between rupture and onset of labor, third trimester: Secondary | ICD-10-CM | POA: Insufficient documentation

## 2023-01-10 DIAGNOSIS — Z3480 Encounter for supervision of other normal pregnancy, unspecified trimester: Secondary | ICD-10-CM

## 2023-01-10 DIAGNOSIS — O4290 Premature rupture of membranes, unspecified as to length of time between rupture and onset of labor, unspecified weeks of gestation: Secondary | ICD-10-CM | POA: Diagnosis present

## 2023-01-10 DIAGNOSIS — Z3A39 39 weeks gestation of pregnancy: Secondary | ICD-10-CM | POA: Insufficient documentation

## 2023-01-10 NOTE — OB Triage Note (Signed)
Wanda Wyatt 32 y.o. W2N5621 @ 39wk2d  presents to Labor & Delivery triage via wheelchair steered by ED staff reporting large gush of fluid at home that continues to leak currently. She denies active vaginal bleeding, but reports some pink tinged discharge. She reports intermittent contractions and states positive fetal movement. External FM and TOCO applied to non-tender abdomen. Initial FHR 155. Vital signs obtained and within normal limits. Patient oriented to care environment including call bell and bed control use. Free, CNM notified of patient's arrival.

## 2023-01-11 ENCOUNTER — Encounter: Payer: Self-pay | Admitting: Certified Nurse Midwife

## 2023-01-11 ENCOUNTER — Inpatient Hospital Stay: Payer: Medicaid Other | Admitting: Certified Registered"

## 2023-01-11 ENCOUNTER — Inpatient Hospital Stay
Admission: RE | Admit: 2023-01-11 | Discharge: 2023-01-12 | DRG: 807 | Disposition: A | Discharge status: Home / Self Care | Payer: Medicaid Other | Attending: Certified Nurse Midwife | Admitting: Certified Nurse Midwife

## 2023-01-11 DIAGNOSIS — O99214 Obesity complicating childbirth: Secondary | ICD-10-CM | POA: Diagnosis not present

## 2023-01-11 DIAGNOSIS — O4292 Full-term premature rupture of membranes, unspecified as to length of time between rupture and onset of labor: Principal | ICD-10-CM | POA: Diagnosis present

## 2023-01-11 DIAGNOSIS — O42913 Preterm premature rupture of membranes, unspecified as to length of time between rupture and onset of labor, third trimester: Secondary | ICD-10-CM | POA: Diagnosis not present

## 2023-01-11 DIAGNOSIS — Z3480 Encounter for supervision of other normal pregnancy, unspecified trimester: Principal | ICD-10-CM

## 2023-01-11 DIAGNOSIS — Z3A39 39 weeks gestation of pregnancy: Secondary | ICD-10-CM | POA: Diagnosis not present

## 2023-01-11 DIAGNOSIS — O4290 Premature rupture of membranes, unspecified as to length of time between rupture and onset of labor, unspecified weeks of gestation: Secondary | ICD-10-CM | POA: Diagnosis present

## 2023-01-11 DIAGNOSIS — O4202 Full-term premature rupture of membranes, onset of labor within 24 hours of rupture: Secondary | ICD-10-CM | POA: Diagnosis not present

## 2023-01-11 LAB — CBC
HCT: 36.9 % (ref 36.0–46.0)
Hemoglobin: 12.7 g/dL (ref 12.0–15.0)
MCH: 28 pg (ref 26.0–34.0)
MCHC: 34.4 g/dL (ref 30.0–36.0)
MCV: 81.5 fL (ref 80.0–100.0)
Platelets: 259 10*3/uL (ref 150–400)
RBC: 4.53 MIL/uL (ref 3.87–5.11)
RDW: 15.2 % (ref 11.5–15.5)
WBC: 8 10*3/uL (ref 4.0–10.5)
nRBC: 0 % (ref 0.0–0.2)

## 2023-01-11 LAB — RUPTURE OF MEMBRANE (ROM)PLUS: Rom Plus: POSITIVE

## 2023-01-11 LAB — TYPE AND SCREEN
ABO/RH(D): B POS
Antibody Screen: NEGATIVE

## 2023-01-11 MED ORDER — LACTATED RINGERS IV SOLN: INTRAVENOUS | Status: DC

## 2023-01-11 MED ORDER — LIDOCAINE-EPINEPHRINE (PF) 1.5 %-1:200000 IJ SOLN
INTRAMUSCULAR | Status: DC | PRN
  Administered 2023-01-11: 3 mL

## 2023-01-11 MED ORDER — WITCH HAZEL-GLYCERIN EX PADS: 1.0000 | MEDICATED_PAD | CUTANEOUS | Status: DC | PRN

## 2023-01-11 MED ORDER — FENTANYL 2.5 MCG/ML W/ROPIVACAINE 0.15% IN NS 100 ML EPIDURAL (ARMC)
EPIDURAL | Status: DC | PRN
  Administered 2023-01-11: 12 mL/h via EPIDURAL

## 2023-01-11 MED ORDER — COCONUT OIL OIL: 1.0000 | TOPICAL_OIL | Status: DC | PRN

## 2023-01-11 MED ORDER — OXYTOCIN BOLUS FROM INFUSION
333.0000 mL | Freq: Once | INTRAVENOUS | Status: AC
  Administered 2023-01-11: 333 mL via INTRAVENOUS

## 2023-01-11 MED ORDER — FENTANYL-BUPIVACAINE-NACL 0.5-0.125-0.9 MG/250ML-% EP SOLN
EPIDURAL | Status: AC
  Filled 2023-01-11: qty 250

## 2023-01-11 MED ORDER — AMMONIA AROMATIC IN INHA
RESPIRATORY_TRACT | Status: AC
  Filled 2023-01-11: qty 10

## 2023-01-11 MED ORDER — BENZOCAINE-MENTHOL 20-0.5 % EX AERO: 1.0000 | INHALATION_SPRAY | CUTANEOUS | Status: DC | PRN

## 2023-01-11 MED ORDER — ACETAMINOPHEN 325 MG PO TABS: 650.0000 mg | ORAL_TABLET | ORAL | Status: DC | PRN

## 2023-01-11 MED ORDER — TERBUTALINE SULFATE 1 MG/ML IJ SOLN: 0.2500 mg | Freq: Once | INTRAMUSCULAR | Status: DC | PRN

## 2023-01-11 MED ORDER — ONDANSETRON HCL 4 MG/2ML IJ SOLN: 4.0000 mg | INTRAMUSCULAR | Status: DC | PRN

## 2023-01-11 MED ORDER — ZOLPIDEM TARTRATE 5 MG PO TABS: 5.0000 mg | ORAL_TABLET | Freq: Every evening | ORAL | Status: DC | PRN

## 2023-01-11 MED ORDER — LACTATED RINGERS IV SOLN: 500.0000 mL | INTRAVENOUS | Status: DC | PRN

## 2023-01-11 MED ORDER — DIPHENHYDRAMINE HCL 25 MG PO CAPS: 25.0000 mg | ORAL_CAPSULE | Freq: Four times a day (QID) | ORAL | Status: DC | PRN

## 2023-01-11 MED ORDER — OXYTOCIN 10 UNIT/ML IJ SOLN
INTRAMUSCULAR | Status: AC
  Filled 2023-01-11: qty 2

## 2023-01-11 MED ORDER — FLEET ENEMA 7-19 GM/118ML RE ENEM: 1.0000 | ENEMA | RECTAL | Status: DC | PRN

## 2023-01-11 MED ORDER — SOD CITRATE-CITRIC ACID 500-334 MG/5ML PO SOLN: 30.0000 mL | ORAL | Status: DC | PRN

## 2023-01-11 MED ORDER — EPHEDRINE 5 MG/ML INJ
10.0000 mg | INTRAVENOUS | Status: DC | PRN
  Administered 2023-01-11: 10 mg via INTRAVENOUS
  Filled 2023-01-11: qty 5

## 2023-01-11 MED ORDER — LIDOCAINE HCL (PF) 1 % IJ SOLN
INTRAMUSCULAR | Status: AC
  Filled 2023-01-11: qty 30

## 2023-01-11 MED ORDER — FENTANYL CITRATE (PF) 100 MCG/2ML IJ SOLN: 50.0000 ug | INTRAMUSCULAR | Status: DC | PRN

## 2023-01-11 MED ORDER — OXYTOCIN-SODIUM CHLORIDE 30-0.9 UT/500ML-% IV SOLN
1.0000 m[IU]/min | INTRAVENOUS | Status: DC
  Administered 2023-01-11: 2 m[IU]/min via INTRAVENOUS
  Filled 2023-01-11: qty 500

## 2023-01-11 MED ORDER — PRENATAL MULTIVITAMIN CH
1.0000 | ORAL_TABLET | Freq: Every day | ORAL | Status: DC
  Administered 2023-01-12: 1 via ORAL
  Filled 2023-01-11: qty 1

## 2023-01-11 MED ORDER — LIDOCAINE HCL (PF) 1 % IJ SOLN
INTRAMUSCULAR | Status: DC | PRN
  Administered 2023-01-11: 3 mL via SUBCUTANEOUS

## 2023-01-11 MED ORDER — LIDOCAINE HCL (PF) 1 % IJ SOLN: 30.0000 mL | INTRAMUSCULAR | Status: DC | PRN

## 2023-01-11 MED ORDER — PHENYLEPHRINE 80 MCG/ML (10ML) SYRINGE FOR IV PUSH (FOR BLOOD PRESSURE SUPPORT): 80.0000 ug | PREFILLED_SYRINGE | INTRAVENOUS | Status: DC | PRN

## 2023-01-11 MED ORDER — DIBUCAINE (PERIANAL) 1 % EX OINT: 1.0000 | TOPICAL_OINTMENT | CUTANEOUS | Status: DC | PRN

## 2023-01-11 MED ORDER — EPHEDRINE 5 MG/ML INJ
10.0000 mg | INTRAVENOUS | Status: DC | PRN
  Administered 2023-01-11: 10 mg via INTRAVENOUS

## 2023-01-11 MED ORDER — ONDANSETRON HCL 4 MG/2ML IJ SOLN: 4.0000 mg | Freq: Four times a day (QID) | INTRAMUSCULAR | Status: DC | PRN

## 2023-01-11 MED ORDER — OXYTOCIN-SODIUM CHLORIDE 30-0.9 UT/500ML-% IV SOLN
2.5000 [IU]/h | INTRAVENOUS | Status: DC
  Administered 2023-01-11: 2.5 [IU]/h via INTRAVENOUS

## 2023-01-11 MED ORDER — SIMETHICONE 80 MG PO CHEW: 80.0000 mg | CHEWABLE_TABLET | ORAL | Status: DC | PRN

## 2023-01-11 MED ORDER — MISOPROSTOL 200 MCG PO TABS
ORAL_TABLET | ORAL | Status: AC
  Filled 2023-01-11: qty 4

## 2023-01-11 MED ORDER — BUPIVACAINE HCL (PF) 0.25 % IJ SOLN
INTRAMUSCULAR | Status: DC | PRN
  Administered 2023-01-11 (×2): 4 mL via EPIDURAL

## 2023-01-11 MED ORDER — FENTANYL-BUPIVACAINE-NACL 0.5-0.125-0.9 MG/250ML-% EP SOLN: 12.0000 mL/h | EPIDURAL | Status: DC | PRN

## 2023-01-11 MED ORDER — SENNOSIDES-DOCUSATE SODIUM 8.6-50 MG PO TABS
2.0000 | ORAL_TABLET | Freq: Every day | ORAL | Status: DC
  Administered 2023-01-12: 2 via ORAL
  Filled 2023-01-11: qty 2

## 2023-01-11 MED ORDER — IBUPROFEN 600 MG PO TABS
600.0000 mg | ORAL_TABLET | Freq: Four times a day (QID) | ORAL | Status: DC
  Administered 2023-01-12 (×2): 600 mg via ORAL
  Filled 2023-01-11 (×2): qty 1

## 2023-01-11 MED ORDER — LACTATED RINGERS IV SOLN
500.0000 mL | Freq: Once | INTRAVENOUS | Status: AC
  Administered 2023-01-11: 500 mL via INTRAVENOUS

## 2023-01-11 MED ORDER — ONDANSETRON HCL 4 MG PO TABS: 4.0000 mg | ORAL_TABLET | ORAL | Status: DC | PRN

## 2023-01-11 MED ORDER — DIPHENHYDRAMINE HCL 50 MG/ML IJ SOLN: 12.5000 mg | INTRAMUSCULAR | Status: DC | PRN

## 2023-01-11 NOTE — Anesthesia Preprocedure Evaluation (Signed)
Anesthesia Evaluation  Patient identified by MRN, date of birth, ID band Patient awake    Reviewed: Allergy & Precautions, H&P , NPO status   Airway Mallampati: II  TM Distance: >3 FB Neck ROM: full    Dental no notable dental hx.    Pulmonary neg pulmonary ROS   Pulmonary exam normal breath sounds clear to auscultation       Cardiovascular Exercise Tolerance: Good negative cardio ROS Normal cardiovascular exam Rhythm:regular Rate:Normal     Neuro/Psych negative neurological ROS  negative psych ROS   GI/Hepatic negative GI ROS, Neg liver ROS,,,  Endo/Other  negative endocrine ROS    Renal/GU negative Renal ROS  negative genitourinary   Musculoskeletal   Abdominal   Peds  Hematology negative hematology ROS (+)   Anesthesia Other Findings   Reproductive/Obstetrics (+) Pregnancy                             Anesthesia Physical Anesthesia Plan  ASA: 2  Anesthesia Plan: Epidural   Post-op Pain Management:    Induction:   PONV Risk Score and Plan:   Airway Management Planned:   Additional Equipment:   Intra-op Plan:   Post-operative Plan:   Informed Consent: I have reviewed the patients History and Physical, chart, labs and discussed the procedure including the risks, benefits and alternatives for the proposed anesthesia with the patient or authorized representative who has indicated his/her understanding and acceptance.     Dental Advisory Given  Plan Discussed with: Anesthesiologist and CRNA  Anesthesia Plan Comments:        Anesthesia Quick Evaluation

## 2023-01-11 NOTE — Progress Notes (Signed)
LABOR NOTE   SUBJECTIVE:   Wanda Wyatt is a 32 y.o.  [id number]  at [redacted]w[redacted]d, augmented labor due to term premature rupture of membranes. Received epidural & is now comfortable.    Analgesia: Epidural  OBJECTIVE:  BP (!) 87/50   Pulse 77   Temp 98 F (36.7 C) (Oral)   Resp 18   Ht 5\' 5"  (1.651 m)   Wt 103.9 kg   LMP 04/10/2022 (Exact Date)   SpO2 99%   BMI 38.11 kg/m  No intake/output data recorded.  SVE:   Dilation: 4.5 Effacement (%): 80 Station: -2 Exam by:: Robb Matar CNM CONTRACTIONS: regular, every 2-3 minutes FHR: Fetal heart tracing reviewed. Baseline: 150 Variability: moderate Accelerations: absent Decelerations:variable Category II  Labs: Lab Results  Component Value Date   WBC 8.0 01/11/2023   HGB 12.7 01/11/2023   HCT 36.9 01/11/2023   MCV 81.5 01/11/2023   PLT 259 01/11/2023    ASSESSMENT: 1) Induction of labor due to PROM,  progressing well on pitocin     Coping: epidural in placed     Membranes: SROM clear at 2230 01/10/23           2) Prolonged ROM, no evidence of infection  Principal Problem:   Full-term premature rupture of membranes Active Problems:   Amniotic fluid leaking   PLAN: Pitocin titration per protocol Epidural managed by anesthesia Monitor for infection Anticipate NSVD   Dominica Severin, CNM 01/11/2023 4:43 PM

## 2023-01-11 NOTE — Discharge Summary (Signed)
Postpartum Discharge Summary  Date of Service updated***     Patient Name: Wanda Wyatt DOB: 25-Dec-1990 MRN: [id number]  Date of admission: 01/11/2023 Delivery date:  Delivering provider: Hartley Barefoot L  Date of discharge: 01/11/2023  Admitting diagnosis: Full-term premature rupture of membranes [O42.92] Intrauterine pregnancy: [redacted]w[redacted]d     Secondary diagnosis:  Principal Problem:   Full-term premature rupture of membranes Active Problems:   Amniotic fluid leaking  Additional problems: ***    Discharge diagnosis: Term Pregnancy Delivered                                              Post partum procedures:{Postpartum procedures:23558} Augmentation: Pitocin Complications: None  Hospital course: Induction of Labor With Vaginal Delivery   32 y.o. yo [id number] at [redacted]w[redacted]d was admitted to the hospital 01/11/2023 for induction of labor.  Indication for induction: PROM.  Patient had an labor course complicated by ROM>18h. Membrane Rupture Time/Date: 10:30 PM ,01/10/2023   Delivery Method:Vaginal, Spontaneous  Episiotomy:   Lacerations:    Details of delivery can be found in separate delivery note.  Patient had a postpartum course complicated by***. Patient is discharged home 01/11/23.  Newborn Data: Birth date:01/11/2023  Birth time:7:37 PM  Gender:  Living status:Living  Apgars: ,  Weight:   Magnesium Sulfate received: No BMZ received: No Rhophylac:N/A MMR:N/A T-DaP:Given prenatally Flu: N/A Transfusion:{Transfusion received:30440034}  Physical exam  Vitals:   01/11/23 1755 01/11/23 1800 01/11/23 1821 01/11/23 1902  BP:      Pulse:      Resp:      Temp:   98.1 F (36.7 C) 97.8 F (36.6 C)  TempSrc:    Oral  SpO2: 99% 100%    Weight:      Height:       General: {Exam; general:21111117} Lochia: {Desc; appropriate/inappropriate:30686::"appropriate"} Uterine Fundus: {Desc; firm/soft:30687} Incision: {Exam; incision:21111123} DVT Evaluation: {Exam;  dvt:2111122} Labs: Lab Results  Component Value Date   WBC 8.0 01/11/2023   HGB 12.7 01/11/2023   HCT 36.9 01/11/2023   MCV 81.5 01/11/2023   PLT 259 01/11/2023      Latest Ref Rng & Units 06/13/2022    8:54 PM  CMP  Glucose 70 - 99 mg/dL 147   BUN 6 - 20 mg/dL 14   Creatinine 8.29 - 1.00 mg/dL 5.62   Sodium 130 - 865 mmol/L 136   Potassium 3.5 - 5.1 mmol/L 3.8   Chloride 98 - 111 mmol/L 107   CO2 22 - 32 mmol/L 16   Calcium 8.9 - 10.3 mg/dL 9.5   Total Protein 6.5 - 8.1 g/dL 7.8   Total Bilirubin 0.3 - 1.2 mg/dL 0.6   Alkaline Phos 38 - 126 U/L 86   AST 15 - 41 U/L 20   ALT 0 - 44 U/L 14    Edinburgh Score:    08/16/2022   11:57 AM  Edinburgh Postnatal Depression Scale Screening Tool  I have been able to laugh and see the funny side of things. 0  I have looked forward with enjoyment to things. 0  I have blamed myself unnecessarily when things went wrong. 0  I have been anxious or worried for no good reason. 1  I have felt scared or panicky for no good reason. 1  Things have been getting on top of me. 0  I have been so  unhappy that I have had difficulty sleeping. 0  I have felt sad or miserable. 0  I have been so unhappy that I have been crying. 0  The thought of harming myself has occurred to me. 0  Edinburgh Postnatal Depression Scale Total 2      After visit meds:  Allergies as of 01/11/2023   No Known Allergies   Med Rec must be completed prior to using this [facility]***        Discharge home in stable condition Infant Feeding: Breast Infant Disposition:{CHL IP OB HOME WITH ZOXWRU:04540} Discharge instruction: per After Visit Summary and Postpartum booklet. Activity: Advance as tolerated. Pelvic rest for 6 weeks.  Diet: {OB diet:21111121} Anticipated Birth Control:  partner vasectomy Postpartum Appointment:{Outpatient follow up:23559} Additional Postpartum F/U: {PP Procedure:23957} Future Appointments: Future Appointments  Date Time Provider  Department Center  01/14/2023 10:35 AM Linzie Collin, MD [facility] None   Follow up Visit:      01/11/2023 Dominica Severin, CNM

## 2023-01-11 NOTE — OB Triage Note (Signed)
LABOR & DELIVERY OB TRIAGE NOTE  SUBJECTIVE  HPI Wanda Wyatt is a 32 y.o. [id number] at [redacted]w[redacted]d who presents to Labor & Delivery for evaluation of leaking vaginal fluid, concern for rupture of membranes.  States that she felt a large gush of fluid around 2230 this evening. Fluid was clear with pinkish tinge. She continues to leak fluid and is feeling occasional contractions.   OB History     Gravida  4   Para  2   Term  2   Preterm      AB  1   Living  2      SAB  1   IAB      Ectopic      Multiple      Live Births  2             OBJECTIVE  BP 135/70 (BP Location: Left Arm)   Pulse 83   Temp 98.3 F (36.8 C) (Oral)   Ht 5\' 4"  (1.626 m)   Wt 103.9 kg   LMP 04/10/2022 (Exact Date)   BMI 39.31 kg/m   General: AOx4 Heart: regular rate Lungs: normal work of breathing Abdomen: soft, non tender, gravid Cervical exam:   deferred with shared decision making Presentation: Cephalic by Leopold's  NST I reviewed the NST and it was reactive.  Baseline: 155 bpm Variability: moderate Accelerations: present Decelerations:none Toco: irregular contractions Category I  ASSESSMENT Impression  1) Pregnancy at [id number], [redacted]w[redacted]d, Estimated Date of Delivery: 01/15/23 2) Reassuring maternal/fetal status 3) Premature rupture of membranes confirmed via positive ROM+ 4) GBS neg, afebrile  PLAN 1) Reviewed options with patient including admission for augmentation of labor with pitocin or discharge to home for expectant management with plan to return to L&D at 1030 am without spontaneous onset of labor for augmentation at that time. Discussed increased risk of infection with prolonged rupture of membranes. After this discussion, patient opts for expectant management at this time. 2) Will return at 1030 am if no spontaneous labor or earlier with onset of labor, change in amniotic fluid color, or fever. Patient instructed to check temperature every few hours.    Lindalou Hose  Noel Henandez, CNM  01/11/23  12:48 AM

## 2023-01-11 NOTE — Anesthesia Procedure Notes (Signed)
Epidural Patient location during procedure: OB Start time: 01/11/2023 3:35 PM End time: 01/11/2023 3:55 PM  Staffing Anesthesiologist: Foye Deer, MD Resident/CRNA: Hezzie Bump, CRNA Performed: resident/CRNA   Preanesthetic Checklist Completed: patient identified, IV checked, site marked, risks and benefits discussed, surgical consent, monitors and equipment checked, pre-op evaluation and timeout performed  Epidural Patient position: sitting Prep: ChloraPrep Patient monitoring: heart rate, continuous pulse ox and blood pressure Approach: midline Location: L3-L4 Injection technique: LOR saline  Needle:  Needle type: Tuohy  Needle gauge: 17 G Needle length: 9 cm and 9 Needle insertion depth: 6 cm Catheter type: closed end flexible Catheter size: 19 Gauge Catheter at skin depth: 11 cm Test dose: negative and 1.5% lidocaine with Epi 1:200 K  Assessment Sensory level: T8 Events: blood not aspirated, no cerebrospinal fluid, injection not painful, no injection resistance, no paresthesia and negative IV test  Additional Notes 1 attempt Pt. Evaluated and documentation done after procedure finished. Patient identified. Risks/Benefits/Options discussed with patient including but not limited to bleeding, infection, nerve damage, paralysis, failed block, incomplete pain control, headache, blood pressure changes, nausea, vomiting, reactions to medication both or allergic, itching and postpartum back pain. Confirmed with bedside nurse the patient's most recent platelet count. Confirmed with patient that they are not currently taking any anticoagulation, have any bleeding history or any family history of bleeding disorders. Patient expressed understanding and wished to proceed. All questions were answered. Sterile technique was used throughout the entire procedure. Please see nursing notes for vital signs. Test dose was given through epidural catheter and negative prior to  continuing to dose epidural or start infusion. Warning signs of high block given to the patient including shortness of breath, tingling/numbness in hands, complete motor block, or any concerning symptoms with instructions to call for help. Patient was given instructions on fall risk and not to get out of bed. All questions and concerns addressed with instructions to call with any issues or inadequate analgesia.    Patient tolerated the insertion well without immediate complications.Reason for block:procedure for pain

## 2023-01-11 NOTE — H&P (Signed)
History and Physical   HPI  Wanda Wyatt is a 32 y.o. [id number] at [redacted]w[redacted]d Estimated Date of Delivery: 01/15/23 who is being admitted for PROM, clear fluid at 2230 01/10/23. She reports continued small amounts of clear fluid leaking as well as occasional contractions. Endorses fetal movement. Denies headache, abdominal pain or visual changes.  Denies pregnancy complications. Plans epidural for pain management.   OB History  OB History  Gravida Para Term Preterm AB Living  4 2 2  0 1 2  SAB IAB Ectopic Multiple Live Births  1 0 0 0 2    # Outcome Date GA Lbr Len/2nd Weight Sex Delivery Anes PTL Lv  4 Current           3 Term 2018   3033 g M Vag-Spont   LIV  2 SAB 2017     SAB     1 Term 2011   3147 g F Vag-Spont   LIV    PROBLEM LIST  Pregnancy complications or risks: Patient Active Problem List   Diagnosis Date Noted   Amniotic fluid leaking 01/11/2023   Full-term premature rupture of membranes 01/11/2023   Obesity in pregnancy 12/24/2022   Supervision of other normal pregnancy, antepartum 08/16/2022    Prenatal labs and studies: ABO, Rh: --/--/PENDING (07/09 1104) Antibody: PENDING (07/09 1104) Rubella: 5.69 (02/16 0914) RPR: Non Reactive (04/17 0932)  HBsAg: Negative (02/16 0914)  HIV: Non Reactive (04/17 0932)  JYN:WGNFAOZH/-- (06/14 0908)   History reviewed. No pertinent past medical history.   History reviewed. No pertinent surgical history.   Medications    Current Discharge Medication List     CONTINUE these medications which have NOT CHANGED   Details  Prenatal Vit-Fe Fumarate-FA (MULTIVITAMIN-PRENATAL) 27-0.8 MG TABS tablet Take 1 tablet by mouth daily at 12 noon.         Allergies  Patient has no known allergies.  Review of Systems  Pertinent items are noted in HPI.  Physical Exam  BP 124/72 (BP Location: Left Arm)   Pulse 94   Temp 98.3 F (36.8 C) (Oral)   Resp 18   Ht 5\' 5"  (1.651 m)   Wt 103.9 kg   LMP 04/10/2022 (Exact Date)    BMI 38.11 kg/m   General: A&Ox3, NAD Lungs:  normal work of breathing Cardio: Regular rate, no edema. Abd: Soft, gravid, NT Presentation: cephalic CERVIX: Dilation: 3 Effacement (%): 50 Station: -3 Presentation: Vertex Exam by:: Robb Matar CNM  See Prenatal records for more detailed PE.     FHR:  Baseline: 150 Variability: moderate Accelerations: present Decelerations: absent Toco: occasional, irregular Category I  Test Results  Results for orders placed or performed during the hospital encounter of 01/11/23 (from the past 24 hour(s))  CBC     Status: None   Collection Time: 01/11/23 11:04 AM  Result Value Ref Range   WBC 8.0 4.0 - 10.5 K/uL   RBC 4.53 3.87 - 5.11 MIL/uL   Hemoglobin 12.7 12.0 - 15.0 g/dL   HCT 08.6 57.8 - 46.9 %   MCV 81.5 80.0 - 100.0 fL   MCH 28.0 26.0 - 34.0 pg   MCHC 34.4 30.0 - 36.0 g/dL   RDW 62.9 52.8 - 41.3 %   Platelets 259 150 - 400 K/uL   nRBC 0.0 0.0 - 0.2 %  Type and screen [facility]     Status: None (Preliminary result)   Collection Time: 01/11/23 11:04 AM  Result Value Ref Range  ABO/RH(D) PENDING    Antibody Screen PENDING    Sample Expiration      01/14/2023,2359 Performed at [facility], [address] [place], [place] [address]    Group B Strep negative  Assessment   [id number] at [redacted]w[redacted]d Estimated Date of Delivery: 01/15/23  Reassuring maternal/fetal status.  Patient Active Problem List   Diagnosis Date Noted   Amniotic fluid leaking 01/11/2023   Full-term premature rupture of membranes 01/11/2023   Obesity in pregnancy 12/24/2022   Supervision of other normal pregnancy, antepartum 08/16/2022    Plan  1. Admit to L&D, pitocin for augmentation; epidural when desired. 2. EFM: CEFM -- Category 1 3. Pharmacologic pain relief if desired.   4. Admission labs  5. Anticipate NSVD 6. MD notified of admission  Dominica Severin, CNM 01/11/2023 11:59 AM

## 2023-01-12 DIAGNOSIS — Z3A39 39 weeks gestation of pregnancy: Secondary | ICD-10-CM | POA: Diagnosis not present

## 2023-01-12 DIAGNOSIS — O4202 Full-term premature rupture of membranes, onset of labor within 24 hours of rupture: Secondary | ICD-10-CM | POA: Diagnosis not present

## 2023-01-12 LAB — CBC
HCT: 34.6 % — ABNORMAL LOW (ref 36.0–46.0)
Hemoglobin: 11.6 g/dL — ABNORMAL LOW (ref 12.0–15.0)
MCH: 27.7 pg (ref 26.0–34.0)
MCHC: 33.5 g/dL (ref 30.0–36.0)
MCV: 82.6 fL (ref 80.0–100.0)
Platelets: 213 10*3/uL (ref 150–400)
RBC: 4.19 MIL/uL (ref 3.87–5.11)
RDW: 15.7 % — ABNORMAL HIGH (ref 11.5–15.5)
WBC: 9.4 10*3/uL (ref 4.0–10.5)
nRBC: 0 % (ref 0.0–0.2)

## 2023-01-12 LAB — SYPHILIS: RPR W/REFLEX TO RPR TITER AND TREPONEMAL ANTIBODIES, TRADITIONAL SCREENING AND DIAGNOSIS ALGORITHM: RPR Ser Ql: NONREACTIVE

## 2023-01-12 MED ORDER — IBUPROFEN 600 MG PO TABS: 600.0000 mg | ORAL_TABLET | Freq: Four times a day (QID) | ORAL | 0 refills | Status: AC

## 2023-01-12 NOTE — Anesthesia Postprocedure Evaluation (Signed)
Anesthesia Post Note  Patient: Wanda Wyatt  Procedure(s) Performed: AN AD HOC EPIDURAL  Patient location during evaluation: Mother Baby Anesthesia Type: Epidural Level of consciousness: awake and alert Pain management: pain level controlled Vital Signs Assessment: post-procedure vital signs reviewed and stable Respiratory status: spontaneous breathing, nonlabored ventilation and respiratory function stable Cardiovascular status: stable Postop Assessment: no headache, no backache and epidural receding Anesthetic complications: no   No notable events documented.   Last Vitals:  Vitals:   01/11/23 2328 01/12/23 0259  BP: (!) 107/59 116/70  Pulse: 90 (!) 102  Resp: 18 18  Temp: 36.5 C 36.9 C  SpO2: 99% 98%    Last Pain:  Vitals:   01/12/23 0259  TempSrc: Oral  PainSc:                  [name]

## 2023-01-12 NOTE — Progress Notes (Signed)
Patient d/c home with infant. D/c instructions, Rx, and f/u appt given to and reviewed with pt. Pt verbalized understanding. Escorted out by staff.  

## 2023-01-12 NOTE — Addendum Note (Signed)
Addendum  created 01/12/23 0732 by [name], CRNA   Clinical Note Signed

## 2023-01-14 ENCOUNTER — Encounter: Payer: Medicaid Other | Admitting: Obstetrics and Gynecology

## 2023-01-14 DIAGNOSIS — Z3483 Encounter for supervision of other normal pregnancy, third trimester: Secondary | ICD-10-CM

## 2023-01-14 DIAGNOSIS — Z3A39 39 weeks gestation of pregnancy: Secondary | ICD-10-CM

## 2023-01-27 ENCOUNTER — Telehealth (INDEPENDENT_AMBULATORY_CARE_PROVIDER_SITE_OTHER): Payer: Medicaid Other | Admitting: Certified Nurse Midwife

## 2023-01-27 NOTE — Progress Notes (Signed)
   Virtual Visit via Video Note  I connected with Wanda Wyatt on 01/27/23 at   3:55 PM EDT by a video enabled telemedicine application and verified that I am speaking with the correct person using two identifiers.  Location: Patient: Home Provider: [facility]   I discussed the limitations of evaluation and management by telemedicine and the availability of in person appointments. The patient expressed understanding and agreed to proceed.    History of Present Illness:   Wanda Wyatt is a 32 y.o. [phone] female who presents for a 2 week televisit for mood check. She is 2 weeks postpartum following a spontaneous vaginal delivery.  The delivery was at 39 gestational weeks.  Postpartum course has been well so far. Baby is feeding by breast on demand every 1.5-2.5h. Bleeding: light, no clots or odor. Postpartum depression screening: negative.  EDPS score is 0.      The following portions of the patient's history were reviewed and updated as appropriate: allergies, current medications, past family history, past medical history, past social history, past surgical history, and problem list.   Observations/Objective:   currently breastfeeding. Gen App: NAD Psych: normal speech, affect. Good mood.        01/27/2023    3:22 PM 01/12/2023    2:02 PM 08/16/2022   11:57 AM  Edinburgh Postnatal Depression Scale Screening Tool  I have been able to laugh and see the funny side of things. 0 0 0  I have looked forward with enjoyment to things. 0 0 0  I have blamed myself unnecessarily when things went wrong. 0 1 0  I have been anxious or worried for no good reason. 0 2 1  I have felt scared or panicky for no good reason. 0 2 1  Things have been getting on top of me. 0 1 0  I have been so unhappy that I have had difficulty sleeping. 0 0 0  I have felt sad or miserable. 0 0 0  I have been so unhappy that I have been crying. 0 0 0  The thought of harming myself has occurred to me. 0 0 0   Edinburgh Postnatal Depression Scale Total 0 6 2         Assessment and Plan:   1. Encounter for screening for maternal depression - Screening Negative today. Will rescreen at 6 week postpartum visit. Overall doing well.    2. Postpartum state - Overall doing well. Continue routine postpartum home care. Planning partner vasectomy for contraception.   3. Lactating mother - Continue feeding on demand   Follow Up Instructions:     I discussed the assessment and treatment plan with the patient. The patient was provided an opportunity to ask questions and all were answered. The patient agreed with the plan and demonstrated an understanding of the instructions.   The patient was advised to call back or seek an in-person evaluation if the symptoms worsen or if the condition fails to improve as anticipated.   Dominica Severin, CNM

## 2023-02-22 ENCOUNTER — Encounter: Payer: Self-pay | Admitting: Certified Nurse Midwife

## 2023-02-22 ENCOUNTER — Ambulatory Visit (INDEPENDENT_AMBULATORY_CARE_PROVIDER_SITE_OTHER): Payer: Medicaid Other | Admitting: Certified Nurse Midwife

## 2023-02-22 DIAGNOSIS — Z3009 Encounter for other general counseling and advice on contraception: Secondary | ICD-10-CM

## 2023-02-22 NOTE — Progress Notes (Signed)
Post Partum Visit Note  Wanda Wyatt is a 32 y.o. [phone] female who presents for a postpartum visit. She is 6 weeks postpartum following a normal spontaneous vaginal delivery.  I have fully reviewed the prenatal and intrapartum course. The delivery was at 39+3 gestational weeks.  Anesthesia: epidural. Postpartum course has been uncomplicated. Baby is doing well. Baby is feeding by breast. Bleeding no bleeding. Bowel function is normal. Bladder function is normal. Patient is not sexually active. Contraception method is  partner vasectomy . Postpartum depression screening: negative.   Upstream - 02/22/23 1400       Pregnancy Intention Screening   Does the patient want to become pregnant in the next year? No      Contraception Wrap Up   Current Method Abstinence    End Method Vasectomy    Contraception Counseling Provided Yes            The pregnancy intention screening data noted above was reviewed. Potential methods of contraception were discussed. The patient elected to proceed with Vasectomy.    Health Maintenance Due  Topic Date Due   DTaP/Tdap/Td (1 - Tdap) Never done   COVID-19 Vaccine (1 - 2023-24 season) Never done   INFLUENZA VACCINE  02/03/2023    The following portions of the patient's history were reviewed and updated as appropriate: allergies, current medications, past family history, past medical history, past social history, past surgical history, and problem list.  Review of Systems Pertinent items are noted in HPI.  Objective:  Ht 5\' 4"  (1.626 m)   Wt 209 lb (94.8 kg)   Breastfeeding Yes   BMI 35.87 kg/m    General:  alert, cooperative, and appears stated age   Breasts:  Soft, without masses, nipples everted & intact; lactating  Lungs: clear to auscultation bilaterally  Heart:  regular rate and rhythm, S1, S2 normal, no murmur, click, rub or gallop  Abdomen: normal findings: soft, non-tender and diastasis <40fb         Assessment:  1. Routine  postpartum follow-up  2. Postpartum care and examination of lactating mother  3. Encounter for counseling regarding contraception    Nortmal postpartum exam.   Plan:   Essential components of care per ACOG recommendations:  1.  Mood and well being: Patient with negative depression screening today. Reviewed local resources for support.  - Patient tobacco use? No.   - hx of drug use? No.    2. Infant care and feeding:  -Patient currently breastmilk feeding?  Continue PNV while lactating. -Social determinants of health (SDOH) reviewed in EPIC. No concerns  3. Sexuality, contraception and birth spacing - Patient does not want a pregnancy in the next year.  Desired family size is 3 children.  - Reviewed reproductive life planning. Reviewed contraceptive methods based on pt preferences and effectiveness.  Patient desired Vasectomy today.   - Discussed birth spacing of 18 months  4. Sleep and fatigue -Encouraged family/partner/community support of 4 hrs of uninterrupted sleep to help with mood and fatigue  5. Physical Recovery  - Discussed patients delivery and complications. She describes her labor as good. - Patient had a Vaginal, no problems at delivery. Patient had no laceration.  - Patient has urinary incontinence? No. - Patient is safe to resume physical and sexual activity  6.  Health Maintenance - HM due items addressed Yes - Last pap smear  Diagnosis  Date Value Ref Range Status  08/24/2022   Final   - Negative for  intraepithelial lesion or malignancy (NILM)   Pap smear not done at today's visit.  -Breast Cancer screening indicated? No.   7. Chronic Disease/Pregnancy Condition follow up: None  - PCP follow up  Dominica Severin, CNM [facility], [facility]
